# Patient Record
Sex: Female | Born: 1980 | Race: White | Hispanic: No | Marital: Married | Smoking: Never smoker
Health system: Southern US, Community
[De-identification: ages and names within clinical notes are randomized; demographics above are authoritative.]

## PROBLEM LIST (undated history)

## (undated) DIAGNOSIS — R112 Nausea with vomiting, unspecified: Secondary | ICD-10-CM

## (undated) DIAGNOSIS — Z9889 Other specified postprocedural states: Secondary | ICD-10-CM

## (undated) DIAGNOSIS — R87619 Unspecified abnormal cytological findings in specimens from cervix uteri: Secondary | ICD-10-CM

## (undated) HISTORY — DX: Unspecified abnormal cytological findings in specimens from cervix uteri: R87.619

## (undated) HISTORY — PX: WISDOM TOOTH EXTRACTION: SHX21

---

## 2006-10-25 ENCOUNTER — Inpatient Hospital Stay: Payer: Self-pay | Admitting: Unknown Physician Specialty

## 2010-10-04 ENCOUNTER — Inpatient Hospital Stay: Payer: Self-pay | Admitting: Obstetrics and Gynecology

## 2014-09-10 ENCOUNTER — Other Ambulatory Visit: Payer: Self-pay

## 2014-09-10 ENCOUNTER — Other Ambulatory Visit: Payer: Self-pay | Admitting: Obstetrics and Gynecology

## 2014-09-10 DIAGNOSIS — N926 Irregular menstruation, unspecified: Secondary | ICD-10-CM

## 2014-09-11 ENCOUNTER — Other Ambulatory Visit: Payer: Self-pay | Admitting: Obstetrics and Gynecology

## 2014-09-11 LAB — BETA HCG QUANT (REF LAB)

## 2014-09-11 MED ORDER — MEDROXYPROGESTERONE ACETATE 10 MG PO TABS: 10.0000 mg | ORAL_TABLET | Freq: Every day | ORAL | Status: DC

## 2014-09-24 ENCOUNTER — Other Ambulatory Visit: Payer: Self-pay | Admitting: Obstetrics and Gynecology

## 2014-09-24 DIAGNOSIS — N911 Secondary amenorrhea: Secondary | ICD-10-CM

## 2014-09-25 ENCOUNTER — Ambulatory Visit: Payer: Self-pay

## 2014-09-25 ENCOUNTER — Other Ambulatory Visit: Payer: Self-pay | Admitting: Obstetrics and Gynecology

## 2014-09-25 ENCOUNTER — Encounter: Payer: Self-pay | Admitting: Obstetrics and Gynecology

## 2014-09-25 ENCOUNTER — Ambulatory Visit (INDEPENDENT_AMBULATORY_CARE_PROVIDER_SITE_OTHER): Payer: BLUE CROSS/BLUE SHIELD | Admitting: Obstetrics and Gynecology

## 2014-09-25 VITALS — BP 106/77 | HR 72 | Ht 66.0 in | Wt 152.7 lb

## 2014-09-25 DIAGNOSIS — N911 Secondary amenorrhea: Secondary | ICD-10-CM

## 2014-09-25 DIAGNOSIS — N926 Irregular menstruation, unspecified: Secondary | ICD-10-CM

## 2014-09-25 LAB — BETA HCG QUANT (REF LAB): HCG QUANT: 3500 m[IU]/mL

## 2014-09-25 NOTE — Progress Notes (Signed)
Patient ID: [name], female   DOB: Dec 26, 1980, 34 y.o.   MRN: [id number]  Here for ultrasound due to secondary amenorrhea after stopping Nuvaring 8 weeks ago:  S: reports breast tenderness and fatigue x 1-2 weeks  O: Indications: Amenorrhea Findings:  The uterus measures 8.2 x 6.1 x 5.2 cm. Echo texture is homogenous without evidence of focal masses.  The Endometrium measures 15.0 mm, and appears to have a decidual reaction appearance. There is a small anechoic/cystic type structure within the endometrial canal which may represent an early gest sac measuring for 5 weeks 2 day. There is also what appears to be a subchorionic hemorrhage measuring 1.3 x 0.5 cm.   Right Ovary measures 3.1 x 2.0 x 2.6 cm. A 1.1 x 1.2 x 1.4 cm corpus luteal cyst is also seen. Otherwise, appears WNL.  Left Ovary measures 2.3 x 1.2 x 1.2 cm. It is normal appearance. Survey of the adnexa demonstrates no adnexal masses. There is a trace of free fluid seen in the cul-de-sac.   Impression: 1. Possible decidual reaction with early gest sac vs other eitology seen in the endometrium.  2. Corpus luteal cyst seen on right ovary.   Recommendations: 1.Clinical correlation with the patient's History and Physical Exam.   Elliott,Teresa, Rad Tech    Scan reviewed and agree with findings. Quant Bhcg repeated and will follow up accordingly.  Reviewed findings with patient  Ashley Barrett, CNM

## 2014-10-04 ENCOUNTER — Ambulatory Visit (INDEPENDENT_AMBULATORY_CARE_PROVIDER_SITE_OTHER): Payer: BLUE CROSS/BLUE SHIELD | Admitting: Obstetrics and Gynecology

## 2014-10-04 ENCOUNTER — Other Ambulatory Visit: Payer: BLUE CROSS/BLUE SHIELD

## 2014-10-04 ENCOUNTER — Encounter: Payer: Self-pay | Admitting: Obstetrics and Gynecology

## 2014-10-04 ENCOUNTER — Other Ambulatory Visit: Payer: Self-pay | Admitting: Obstetrics and Gynecology

## 2014-10-04 DIAGNOSIS — N926 Irregular menstruation, unspecified: Secondary | ICD-10-CM

## 2014-10-04 NOTE — Progress Notes (Signed)
Indications:Unsure LMP Findings:  Ashley Barrett intrauterine pregnancy is visualized with a CRL consistent with 6 0/[redacted] weeks gestation, giving an (U/S) EDD of 05/30/2015. The (U/S) EDD is NOT consistent with the clinically established (LMP) EDD of 05/06/2015 by more than 1 week.  FHR: 115 bpm CRL measurement: 3.6 mm Yolk sac and and early anatomy is normal.  Right Ovary measures 3.0 x 2.2 x 2.2 cm. It is normal in appearance. Left Ovary measures 2.6 x 1.1 x  1.9 cm. It is normal appearance. There is evidence of a corpus luteal cyst in the Right Survey of the adnexa demonstrates no adnexal masses. There is no free peritoneal fluid in the cul de sac.  Impression: 1. 6 0/7 week Viable Singleton Intrauterine pregnancy by U/S. 2. (U/S) EDD is NOT consistent with Clinically established (LMP) EDD of 05/06/2015 .  Recommendations: 1.Clinical correlation with the patient's History and Physical Exam. 2. Recommend changing EDC to 05/30/2015.  Lewis,Amber, [employer] reviewed and agree with findings. EDC changed to 05/30/15 Reviewed with patient and spouse.  Melody Trudee Kuster, CNM

## 2014-10-04 NOTE — Patient Instructions (Signed)
First Trimester of Pregnancy The first trimester of pregnancy is from week 1 until the end of week 12 (months 1 through 3). A week after a sperm fertilizes an egg, the egg will implant on the wall of the uterus. This embryo will begin to develop into a baby. Genes from you and your partner are forming the baby. The female genes determine whether the baby is a boy or a girl. At 6-8 weeks, the eyes and face are formed, and the heartbeat can be seen on ultrasound. At the end of 12 weeks, all the baby's organs are formed.  Now that you are pregnant, you will want to do everything you can to have a healthy baby. Two of the most important things are to get good prenatal care and to follow your health care provider's instructions. Prenatal care is all the medical care you receive before the baby's birth. This care will help prevent, find, and treat any problems during the pregnancy and childbirth. BODY CHANGES Your body goes through many changes during pregnancy. The changes vary from woman to woman.   You may gain or lose a couple of pounds at first.  You may feel sick to your stomach (nauseous) and throw up (vomit). If the vomiting is uncontrollable, call your health care provider.  You may tire easily.  You may develop headaches that can be relieved by medicines approved by your health care provider.  You may urinate more often. Painful urination may mean you have a bladder infection.  You may develop heartburn as a result of your pregnancy.  You may develop constipation because certain hormones are causing the muscles that push waste through your intestines to slow down.  You may develop hemorrhoids or swollen, bulging veins (varicose veins).  Your breasts may begin to grow larger and become tender. Your nipples may stick out more, and the tissue that surrounds them (areola) may become darker.  Your gums may bleed and may be sensitive to brushing and flossing.  Dark spots or blotches (chloasma,  mask of pregnancy) may develop on your face. This will likely fade after the baby is born.  Your menstrual periods will stop.  You may have a loss of appetite.  You may develop cravings for certain kinds of food.  You may have changes in your emotions from day to day, such as being excited to be pregnant or being concerned that something may go wrong with the pregnancy and baby.  You may have more vivid and strange dreams.  You may have changes in your hair. These can include thickening of your hair, rapid growth, and changes in texture. Some women also have hair loss during or after pregnancy, or hair that feels dry or thin. Your hair will most likely return to normal after your baby is born. WHAT TO EXPECT AT YOUR PRENATAL VISITS During a routine prenatal visit:  You will be weighed to make sure you and the baby are growing normally.  Your blood pressure will be taken.  Your abdomen will be measured to track your baby's growth.  The fetal heartbeat will be listened to starting around week 10 or 12 of your pregnancy.  Test results from any previous visits will be discussed. Your health care provider may ask you:  How you are feeling.  If you are feeling the baby move.  If you have had any abnormal symptoms, such as leaking fluid, bleeding, severe headaches, or abdominal cramping.  If you have any questions. Other tests   that may be performed during your first trimester include:  Blood tests to find your blood type and to check for the presence of any previous infections. They will also be used to check for low iron levels (anemia) and Rh antibodies. Later in the pregnancy, blood tests for diabetes will be done along with other tests if problems develop.  Urine tests to check for infections, diabetes, or protein in the urine.  An ultrasound to confirm the proper growth and development of the baby.  An amniocentesis to check for possible genetic problems.  Fetal screens for  spina bifida and Down syndrome.  You may need other tests to make sure you and the baby are doing well. HOME CARE INSTRUCTIONS  Medicines  Follow your health care provider's instructions regarding medicine use. Specific medicines may be either safe or unsafe to take during pregnancy.  Take your prenatal vitamins as directed.  If you develop constipation, try taking a stool softener if your health care provider approves. Diet  Eat regular, well-balanced meals. Choose a variety of foods, such as meat or vegetable-based protein, fish, milk and low-fat dairy products, vegetables, fruits, and whole grain breads and cereals. Your health care provider will help you determine the amount of weight gain that is right for you.  Avoid raw meat and uncooked cheese. These carry germs that can cause birth defects in the baby.  Eating four or five small meals rather than three large meals a day may help relieve nausea and vomiting. If you start to feel nauseous, eating a few soda crackers can be helpful. Drinking liquids between meals instead of during meals also seems to help nausea and vomiting.  If you develop constipation, eat more high-fiber foods, such as fresh vegetables or fruit and whole grains. Drink enough fluids to keep your urine clear or pale yellow. Activity and Exercise  Exercise only as directed by your health care provider. Exercising will help you:  Control your weight.  Stay in shape.  Be prepared for labor and delivery.  Experiencing pain or cramping in the lower abdomen or low back is a good sign that you should stop exercising. Check with your health care provider before continuing normal exercises.  Try to avoid standing for long periods of time. Move your legs often if you must stand in one place for a long time.  Avoid heavy lifting.  Wear low-heeled shoes, and practice good posture.  You may continue to have sex unless your health care provider directs you  otherwise. Relief of Pain or Discomfort  Wear a good support bra for breast tenderness.   Take warm sitz baths to soothe any pain or discomfort caused by hemorrhoids. Use hemorrhoid cream if your health care provider approves.   Rest with your legs elevated if you have leg cramps or low back pain.  If you develop varicose veins in your legs, wear support hose. Elevate your feet for 15 minutes, 3-4 times a day. Limit salt in your diet. Prenatal Care  Schedule your prenatal visits by the twelfth week of pregnancy. They are usually scheduled monthly at first, then more often in the last 2 months before delivery.  Write down your questions. Take them to your prenatal visits.  Keep all your prenatal visits as directed by your health care provider. Safety  Wear your seat belt at all times when driving.  Make a list of emergency phone numbers, including numbers for family, friends, the hospital, and police and fire departments. General Tips    Ask your health care provider for a referral to a local prenatal education class. Begin classes no later than at the beginning of month 6 of your pregnancy.  Ask for help if you have counseling or nutritional needs during pregnancy. Your health care provider can offer advice or refer you to specialists for help with various needs.  Do not use hot tubs, steam rooms, or saunas.  Do not douche or use tampons or scented sanitary pads.  Do not cross your legs for long periods of time.  Avoid cat litter boxes and soil used by cats. These carry germs that can cause birth defects in the baby and possibly loss of the fetus by miscarriage or stillbirth.  Avoid all smoking, herbs, alcohol, and medicines not prescribed by your health care provider. Chemicals in these affect the formation and growth of the baby.  Schedule a dentist appointment. At home, brush your teeth with a soft toothbrush and be gentle when you floss. SEEK MEDICAL CARE IF:   You have  dizziness.  You have mild pelvic cramps, pelvic pressure, or nagging pain in the abdominal area.  You have persistent nausea, vomiting, or diarrhea.  You have a bad smelling vaginal discharge.  You have pain with urination.  You notice increased swelling in your face, hands, legs, or ankles. SEEK IMMEDIATE MEDICAL CARE IF:   You have a fever.  You are leaking fluid from your vagina.  You have spotting or bleeding from your vagina.  You have severe abdominal cramping or pain.  You have rapid weight gain or loss.  You vomit blood or material that looks like coffee grounds.  You are exposed to German measles and have never had them.  You are exposed to fifth disease or chickenpox.  You develop a severe headache.  You have shortness of breath.  You have any kind of trauma, such as from a fall or a car accident. Document Released: 01/05/2001 Document Revised: 05/28/2013 Document Reviewed: 11/21/2012 ExitCare Patient Information 2015 [employer]. This information is not intended to replace advice given to you by your health care provider. Make sure you discuss any questions you have with your health care provider.  

## 2014-10-17 ENCOUNTER — Other Ambulatory Visit: Payer: BLUE CROSS/BLUE SHIELD

## 2014-10-18 LAB — BETA HCG QUANT (REF LAB): HCG QUANT: 209472 m[IU]/mL

## 2014-11-08 ENCOUNTER — Encounter: Payer: Self-pay | Admitting: Obstetrics and Gynecology

## 2014-11-08 ENCOUNTER — Ambulatory Visit (INDEPENDENT_AMBULATORY_CARE_PROVIDER_SITE_OTHER): Payer: BLUE CROSS/BLUE SHIELD | Admitting: Obstetrics and Gynecology

## 2014-11-08 VITALS — BP 105/74 | HR 69 | Wt 152.0 lb

## 2014-11-08 DIAGNOSIS — Z3491 Encounter for supervision of normal pregnancy, unspecified, first trimester: Secondary | ICD-10-CM

## 2014-11-08 LAB — POCT URINALYSIS DIPSTICK
Bilirubin, UA: NEGATIVE
Blood, UA: NEGATIVE
Glucose, UA: NEGATIVE
KETONES UA: 5
Nitrite, UA: NEGATIVE
PH UA: 6
PROTEIN UA: NEGATIVE
Spec Grav, UA: 1.01
UROBILINOGEN UA: 0.2

## 2014-11-08 NOTE — Patient Instructions (Signed)

## 2014-11-08 NOTE — Progress Notes (Signed)
NOB-pt is having some cramping, slight nausea

## 2014-11-08 NOTE — Progress Notes (Signed)
NEW OB HISTORY AND PHYSICAL  SUBJECTIVE:       Ashley Barrett is a 34 y.o. G1P0 female, Patient's last menstrual period was 08/23/2014., Estimated Date of Delivery: 05/30/15, [redacted]w[redacted]d, presents today for establishment of Prenatal Care. She has no unusual complaints and complains of fatigue      Gynecologic History Patient's last menstrual period was 08/23/2014. Abnormal Contraception: none Last Pap: 2016. Results were: normal  Obstetric History OB History  Gravida Para Term Preterm AB SAB TAB Ectopic Multiple Living  1             # Outcome Date GA Lbr Len/2nd Weight Sex Delivery Anes PTL Lv  1 Current               History reviewed. No pertinent past medical history.  History reviewed. No pertinent past surgical history.  Current Outpatient Prescriptions on File Prior to Visit  Medication Sig Dispense Refill  . Prenatal Vit-Fe Fumarate-FA (PRENATAL MULTIVITAMIN) TABS tablet Take 1 tablet by mouth daily at 12 noon.    . medroxyPROGESTERone (PROVERA) 10 MG tablet Take 1 tablet (10 mg total) by mouth daily. Use for ten days (Patient not taking: Reported on 09/25/2014) 10 tablet 2   No current facility-administered medications on file prior to visit.    No Known Allergies  Social History   Social History  . Marital Status: Married    Spouse Name: N/A  . Number of Children: N/A  . Years of Education: N/A   Occupational History  . Not on file.   Social History Main Topics  . Smoking status: Never Smoker   . Smokeless tobacco: Never Used  . Alcohol Use: No  . Drug Use: No  . Sexual Activity: Yes    Birth Control/ Protection: None   Other Topics Concern  . Not on file   Social History Narrative    History reviewed. No pertinent family history.  The following portions of the patient's history were reviewed and updated as appropriate: allergies, current medications, past OB history, past medical history, past surgical history, past family history, past social  history, and problem list.    OBJECTIVE: Initial Physical Exam (New OB)  GENERAL APPEARANCE: alert, well appearing, in no apparent distress, oriented to person, place and time HEAD: normocephalic, atraumatic MOUTH: mucous membranes moist, pharynx normal without lesions THYROID: no thyromegaly or masses present BREASTS: no masses noted, no significant tenderness, no palpable axillary nodes, no skin changes LUNGS: clear to auscultation, no wheezes, rales or rhonchi, symmetric air entry HEART: regular rate and rhythm, no murmurs ABDOMEN: soft, nontender, nondistended, no abnormal masses, no epigastric pain, fundus soft, nontender 14 weeks size and FHT present EXTREMITIES: no redness or tenderness in the calves or thighs, no edema SKIN: normal coloration and turgor, no rashes LYMPH NODES: no adenopathy palpable NEUROLOGIC: alert, oriented, normal speech, no focal findings or movement disorder noted  PELVIC EXAM VAGINA: no abnormal discharge or lesions CERVIX: no lesions or cervical motion tenderness UTERUS: gravid and consistent with 14 weeks ADNEXA: no masses palpable and nontender  ASSESSMENT: Normal pregnancy  PLAN: Genetic screening done Prenatal care See orders

## 2014-11-09 LAB — CBC WITH DIFFERENTIAL/PLATELET
BASOS ABS: 0 10*3/uL (ref 0.0–0.2)
Basos: 1 %
EOS (ABSOLUTE): 0.1 10*3/uL (ref 0.0–0.4)
Eos: 1 %
HEMOGLOBIN: 13.3 g/dL (ref 11.1–15.9)
Hematocrit: 38.5 % (ref 34.0–46.6)
Immature Grans (Abs): 0 10*3/uL (ref 0.0–0.1)
Immature Granulocytes: 1 %
LYMPHS ABS: 1.9 10*3/uL (ref 0.7–3.1)
LYMPHS: 33 %
MCH: 30.2 pg (ref 26.6–33.0)
MCHC: 34.5 g/dL (ref 31.5–35.7)
MCV: 88 fL (ref 79–97)
MONOCYTES: 9 %
Monocytes Absolute: 0.5 10*3/uL (ref 0.1–0.9)
NEUTROS PCT: 55 %
Neutrophils Absolute: 3.2 10*3/uL (ref 1.4–7.0)
Platelets: 191 10*3/uL (ref 150–379)
RBC: 4.4 x10E6/uL (ref 3.77–5.28)
RDW: 13.7 % (ref 12.3–15.4)
WBC: 5.9 10*3/uL (ref 3.4–10.8)

## 2014-11-09 LAB — HEP, RPR, HIV PANEL
HEP B S AG: NEGATIVE
HIV Screen 4th Generation wRfx: NONREACTIVE
RPR: NONREACTIVE

## 2014-11-09 LAB — ABO AND RH: Rh Factor: POSITIVE

## 2014-11-09 LAB — ANTIBODY SCREEN: ANTIBODY SCREEN: NEGATIVE

## 2014-11-10 LAB — RUBELLA SCREEN: Rubella Antibodies, IGG: 2.13 index (ref >0.99)

## 2014-11-10 LAB — VARICELLA ZOSTER ANTIBODY, IGG: Varicella zoster IgG: 887 index (ref >165)

## 2014-11-14 ENCOUNTER — Encounter: Payer: Self-pay | Admitting: Obstetrics and Gynecology

## 2014-11-19 ENCOUNTER — Other Ambulatory Visit: Payer: Self-pay | Admitting: Obstetrics and Gynecology

## 2014-11-25 ENCOUNTER — Encounter: Payer: Self-pay | Admitting: Obstetrics and Gynecology

## 2014-12-04 ENCOUNTER — Encounter: Payer: Self-pay | Admitting: Obstetrics and Gynecology

## 2014-12-04 ENCOUNTER — Ambulatory Visit (INDEPENDENT_AMBULATORY_CARE_PROVIDER_SITE_OTHER): Payer: BLUE CROSS/BLUE SHIELD | Admitting: Obstetrics and Gynecology

## 2014-12-04 VITALS — BP 114/85 | HR 90 | Wt 151.7 lb

## 2014-12-04 DIAGNOSIS — Z3492 Encounter for supervision of normal pregnancy, unspecified, second trimester: Secondary | ICD-10-CM

## 2014-12-04 LAB — POCT URINALYSIS DIPSTICK
Bilirubin, UA: NEGATIVE
Blood, UA: NEGATIVE
Glucose, UA: NEGATIVE
Ketones, UA: NEGATIVE
Leukocytes, UA: NEGATIVE
Nitrite, UA: NEGATIVE
Protein, UA: NEGATIVE
Spec Grav, UA: 1.015
Urobilinogen, UA: 0.2
pH, UA: 6

## 2014-12-04 NOTE — Progress Notes (Signed)
ROB- doing well, anatomy scan next visit

## 2014-12-04 NOTE — Progress Notes (Signed)
ROB-pt is feeling good, denies any new complaints

## 2015-01-01 ENCOUNTER — Ambulatory Visit (INDEPENDENT_AMBULATORY_CARE_PROVIDER_SITE_OTHER): Payer: BLUE CROSS/BLUE SHIELD

## 2015-01-01 ENCOUNTER — Ambulatory Visit (INDEPENDENT_AMBULATORY_CARE_PROVIDER_SITE_OTHER): Payer: BLUE CROSS/BLUE SHIELD | Admitting: Obstetrics and Gynecology

## 2015-01-01 VITALS — BP 108/69 | HR 95 | Wt 154.6 lb

## 2015-01-01 DIAGNOSIS — Z3492 Encounter for supervision of normal pregnancy, unspecified, second trimester: Secondary | ICD-10-CM | POA: Diagnosis not present

## 2015-01-01 DIAGNOSIS — O4402 Placenta previa specified as without hemorrhage, second trimester: Secondary | ICD-10-CM

## 2015-01-01 DIAGNOSIS — O4412 Placenta previa with hemorrhage, second trimester: Secondary | ICD-10-CM

## 2015-01-01 NOTE — Patient Instructions (Signed)
Placenta Previa Placenta previa is a condition in pregnant women where the placenta implants in the lower part of the uterus. The placenta either partially or completely covers the opening to the cervix. This is a problem because the baby must pass through the cervix during delivery. There are three types of placenta previa. They include:  1. Marginal placenta previa. The placenta is near the cervix, but does not cover the opening. 2. Partial placenta previa. The placenta covers part of the cervical opening. 3. Complete placenta previa. The placenta covers the entire cervical opening.  Depending on the type of placenta previa, there is a chance the placenta may move into a normal position and no longer cover the cervix as the pregnancy progresses. It is important to keep all prenatal visits with your caregiver.  RISK FACTORS You may be more likely to develop placenta previa if you:   Are carrying more than one baby (multiples).   Have an abnormally shaped uterus.   Have scars on the lining of the uterus.   Had previous surgeries involving the uterus, such as a cesarean delivery.   Have delivered a baby previously.   Have a history of placenta previa.   Have smoked or used cocaine during pregnancy.   Are age 31 or older during pregnancy.  SYMPTOMS The main symptom of placenta previa is sudden, painless vaginal bleeding during the second half of pregnancy. The amount of bleeding can be light to very heavy. The bleeding may stop on its own, but almost always returns. Cramping, regular contractions, abdominal pain, and lower back pain can also occur with placenta previa.  DIAGNOSIS Placenta previa can be diagnosed through an ultrasound by finding where the placenta is located. The ultrasound may find placenta previa either during a routine prenatal visit or after vaginal bleeding is noticed. If you are diagnosed with placenta previa, your caregiver may avoid vaginal exams to reduce  the risk of heavy bleeding. There is a chance that placenta previa may not be diagnosed until bleeding occurs during labor.  TREATMENT Specific treatment depends on:   How much you are bleeding or if the bleeding has stopped.  How far along you are in your pregnancy.   The condition of the baby.   The location of the baby and placenta.   The type of placenta previa.  Depending on the factors above, your caregiver may recommend:   Decreased activity.   Bed rest at home or in the hospital.  Pelvic rest. This means no sex, using tampons, douching, pelvic exams, or placing anything into the vagina.  A blood transfusion to replace maternal blood loss.  A cesarean delivery if the bleeding is heavy and cannot be controlled or the placenta completely covers the cervix.  Medication to stop premature labor or mature the fetal lungs if delivery is needed before the pregnancy is full term.  WHEN SHOULD YOU SEEK IMMEDIATE MEDICAL CARE IF YOU ARE SENT HOME WITH PLACENTA PREVIA? Seek immediate medical care if you show any symptoms of placenta previa. You will need to go to the hospital to get checked immediately. Again, those symptoms are:  Sudden, painless vaginal bleeding, even a small amount.  Cramping or regular contractions.  Lower back or abdominal pain.   This information is not intended to replace advice given to you by your health care provider. Make sure you discuss any questions you have with your health care provider.   Document Released: 01/11/2005 Document Revised: 02/01/2014 Document Reviewed: 04/14/2012 Elsevier  Interactive Patient Education 2016 [employer]  

## 2015-01-01 NOTE — Progress Notes (Signed)
Indications:Anatomy U/S Findings:  Singleton intrauterine pregnancy is visualized with FHR at 149 BPM. Biometrics give an (U/S) Gestational age of 19 2/7 weeks and an (U/S) EDD of 05/26/15; this correlates with the clinically established EDD of 05/30/15.  Fetal presentation is Vertex.  EFW: 284g (10 oz). Placenta: Posterior, grade 0, complete previa. AFI: adequate with MVP of 4cm.  Anatomic survey is complete and normal; Gender - female  .   Right Ovary was not visualized. Left Ovary was not visualized. Survey of the adnexa demonstrates no adnexal masses. There is no free peritoneal fluid in the cul de sac.  Impression: 1. 19 2/7 week Viable Singleton Intrauterine pregnancy by U/S. 2. (U/S) EDD is consistent with Clinically established (LMP) EDD of 05/30/15. 3. Complete placenta previa.  Recommendations: 1.Clinical correlation with the patient's History and Physical Exam  Ashley Barrett  Scan reviewed and agree with findings, patient and spouse counseled on previa and risks, follow-up and bleeding precautions.

## 2015-01-23 ENCOUNTER — Ambulatory Visit (INDEPENDENT_AMBULATORY_CARE_PROVIDER_SITE_OTHER): Payer: BLUE CROSS/BLUE SHIELD | Admitting: Obstetrics and Gynecology

## 2015-01-23 ENCOUNTER — Encounter: Payer: Self-pay | Admitting: Obstetrics and Gynecology

## 2015-01-23 VITALS — BP 108/64 | HR 95 | Wt 158.7 lb

## 2015-01-23 DIAGNOSIS — J029 Acute pharyngitis, unspecified: Secondary | ICD-10-CM | POA: Diagnosis not present

## 2015-01-23 DIAGNOSIS — Z3492 Encounter for supervision of normal pregnancy, unspecified, second trimester: Secondary | ICD-10-CM

## 2015-01-23 DIAGNOSIS — K219 Gastro-esophageal reflux disease without esophagitis: Secondary | ICD-10-CM

## 2015-01-23 DIAGNOSIS — K141 Geographic tongue: Secondary | ICD-10-CM | POA: Diagnosis not present

## 2015-01-23 DIAGNOSIS — J0300 Acute streptococcal tonsillitis, unspecified: Secondary | ICD-10-CM

## 2015-01-23 LAB — POCT URINALYSIS DIPSTICK
Bilirubin, UA: NEGATIVE
Blood, UA: NEGATIVE
GLUCOSE UA: NEGATIVE
KETONES UA: NEGATIVE
LEUKOCYTES UA: NEGATIVE
Nitrite, UA: NEGATIVE
Spec Grav, UA: 1.015
Urobilinogen, UA: 0.2
pH, UA: 6.5

## 2015-01-23 LAB — POCT RAPID STREP A (OFFICE): Rapid Strep A Screen: POSITIVE — AB

## 2015-01-23 MED ORDER — MAGIC MOUTHWASH: 5.0000 mL | Freq: Three times a day (TID) | ORAL | Status: DC | PRN

## 2015-01-23 MED ORDER — CEFDINIR 300 MG PO CAPS: 300.0000 mg | ORAL_CAPSULE | Freq: Two times a day (BID) | ORAL | Status: DC

## 2015-01-23 MED ORDER — CEFDINIR 125 MG/5ML PO SUSR: 125.0000 mg | Freq: Two times a day (BID) | ORAL | Status: DC

## 2015-01-23 MED ORDER — RANITIDINE HCL 150 MG PO TABS: 150.0000 mg | ORAL_TABLET | Freq: Two times a day (BID) | ORAL | Status: DC

## 2015-01-23 NOTE — Patient Instructions (Signed)

## 2015-01-23 NOTE — Progress Notes (Signed)
Problem OB- reports sore throat and low grade fever x 3 days + strep today- rx for omnicef sent in with refill, to take tylenol prn, also sent in zantac 150mg  for reflux.

## 2015-01-23 NOTE — Addendum Note (Signed)
Addended by: Dorette Grate, Steffanie Mingle L on: 01/23/2015 11:40 AM   Modules accepted: Orders

## 2015-01-23 NOTE — Progress Notes (Signed)
OB work in-c/o sore throat, coughing some productive Pt is also c/o acid reflux  Strep test done in the office was positive

## 2015-01-26 NOTE — L&D Delivery Note (Cosign Needed)
Delivery Note At  a viable and healthy female frontal molding with mild briusing was delivered via  (Presentation:OP ;  ).  APGAR8: ,9 ; weight  .   Placenta status:delivered partial spontaneous with manual extraction of retained small piece.  3 Vessel Cord:  with the following complications:retained placenta and uterine atony with PPH .    Anesthesia:  none Episiotomy:  none Lacerations:  2nd degree Suture Repair: 3.0 vicryl rapide Est. Blood Loss (mL):  1450  Mom to postpartum.  Baby to Couplet care / Skin to Skin.  Melody [employer], CNM 05/24/2015, 12:45 PM

## 2015-01-31 ENCOUNTER — Ambulatory Visit (INDEPENDENT_AMBULATORY_CARE_PROVIDER_SITE_OTHER): Payer: BLUE CROSS/BLUE SHIELD | Admitting: Obstetrics and Gynecology

## 2015-01-31 ENCOUNTER — Encounter: Payer: Self-pay | Admitting: Obstetrics and Gynecology

## 2015-01-31 VITALS — BP 106/71 | HR 82 | Wt 160.1 lb

## 2015-01-31 DIAGNOSIS — Z331 Pregnant state, incidental: Secondary | ICD-10-CM

## 2015-01-31 LAB — POCT URINALYSIS DIPSTICK
BILIRUBIN UA: NEGATIVE
Blood, UA: NEGATIVE
GLUCOSE UA: NEGATIVE
Ketones, UA: NEGATIVE
LEUKOCYTES UA: NEGATIVE
NITRITE UA: NEGATIVE
PH UA: 6.5
Protein, UA: NEGATIVE
Spec Grav, UA: 1.015
Urobilinogen, UA: 0.2

## 2015-01-31 NOTE — Progress Notes (Signed)
ROB-pt denies any complaints 

## 2015-01-31 NOTE — Progress Notes (Signed)
ROB- doing well, no complaints

## 2015-02-21 ENCOUNTER — Ambulatory Visit (INDEPENDENT_AMBULATORY_CARE_PROVIDER_SITE_OTHER): Payer: BLUE CROSS/BLUE SHIELD | Admitting: Obstetrics and Gynecology

## 2015-02-21 ENCOUNTER — Encounter: Payer: Self-pay | Admitting: Obstetrics and Gynecology

## 2015-02-21 VITALS — BP 122/69 | HR 86 | Wt 164.0 lb

## 2015-02-21 DIAGNOSIS — O99891 Other specified diseases and conditions complicating pregnancy: Secondary | ICD-10-CM | POA: Insufficient documentation

## 2015-02-21 DIAGNOSIS — O9989 Other specified diseases and conditions complicating pregnancy, childbirth and the puerperium: Secondary | ICD-10-CM

## 2015-02-21 DIAGNOSIS — Z20828 Contact with and (suspected) exposure to other viral communicable diseases: Secondary | ICD-10-CM

## 2015-02-21 DIAGNOSIS — Z331 Pregnant state, incidental: Secondary | ICD-10-CM

## 2015-02-21 DIAGNOSIS — Z3A00 Weeks of gestation of pregnancy not specified: Secondary | ICD-10-CM

## 2015-02-21 LAB — POCT URINALYSIS DIPSTICK
BILIRUBIN UA: NEGATIVE
Blood, UA: NEGATIVE
Glucose, UA: NEGATIVE
Ketones, UA: NEGATIVE
LEUKOCYTES UA: NEGATIVE
NITRITE UA: NEGATIVE
PH UA: 7
Spec Grav, UA: 1.01
Urobilinogen, UA: 0.2

## 2015-02-21 NOTE — Progress Notes (Signed)
ROB- doing well w/o complaints- found out 3 patients were positive CMV. Will test with next blood draw.

## 2015-02-21 NOTE — Progress Notes (Signed)
ROB- pt is doing well denies any complaints 

## 2015-02-25 ENCOUNTER — Other Ambulatory Visit: Payer: Self-pay | Admitting: *Deleted

## 2015-03-03 ENCOUNTER — Other Ambulatory Visit: Payer: Self-pay | Admitting: *Deleted

## 2015-03-03 DIAGNOSIS — Z131 Encounter for screening for diabetes mellitus: Secondary | ICD-10-CM

## 2015-03-03 DIAGNOSIS — Z331 Pregnant state, incidental: Secondary | ICD-10-CM

## 2015-03-05 ENCOUNTER — Other Ambulatory Visit: Payer: BLUE CROSS/BLUE SHIELD

## 2015-03-05 ENCOUNTER — Ambulatory Visit (INDEPENDENT_AMBULATORY_CARE_PROVIDER_SITE_OTHER): Payer: BLUE CROSS/BLUE SHIELD

## 2015-03-05 ENCOUNTER — Encounter: Payer: Self-pay | Admitting: Obstetrics and Gynecology

## 2015-03-05 ENCOUNTER — Ambulatory Visit (INDEPENDENT_AMBULATORY_CARE_PROVIDER_SITE_OTHER): Payer: BLUE CROSS/BLUE SHIELD | Admitting: Obstetrics and Gynecology

## 2015-03-05 VITALS — BP 111/67 | HR 88 | Wt 166.5 lb

## 2015-03-05 DIAGNOSIS — Z331 Pregnant state, incidental: Secondary | ICD-10-CM

## 2015-03-05 DIAGNOSIS — Z23 Encounter for immunization: Secondary | ICD-10-CM

## 2015-03-05 DIAGNOSIS — Z3493 Encounter for supervision of normal pregnancy, unspecified, third trimester: Secondary | ICD-10-CM

## 2015-03-05 LAB — POCT URINALYSIS DIPSTICK
BILIRUBIN UA: NEGATIVE
Glucose, UA: 250
KETONES UA: NEGATIVE
Leukocytes, UA: NEGATIVE
Nitrite, UA: NEGATIVE
PH UA: 6.5
RBC UA: NEGATIVE
Spec Grav, UA: 1.015
Urobilinogen, UA: 0.2

## 2015-03-05 MED ORDER — TETANUS-DIPHTH-ACELL PERTUSSIS 5-2.5-18.5 LF-MCG/0.5 IM SUSP
0.5000 mL | Freq: Once | INTRAMUSCULAR | Status: AC
  Administered 2015-03-05: 0.5 mL via INTRAMUSCULAR

## 2015-03-05 NOTE — Progress Notes (Signed)
ROB-glucola done, blood consent signed, tdap given

## 2015-03-05 NOTE — Patient Instructions (Signed)
Cesarean Delivery Cesarean delivery is the birth of a baby through a cut (incision) in the abdomen and womb (uterus).  LET YOUR HEALTH CARE PROVIDER KNOW ABOUT:  All medicines you are taking, including vitamins, herbs, eye drops, creams, and over-the-counter medicines.  Previous problems you or members of your family have had with the use of anesthetics.  Any bleeding or blood clotting disorders you have.  Family history of blood clots or bleeding disorders.  Any history of deep vein thrombosis (DVT) or pulmonary embolism (PE).  Previous surgeries you have had.  Medical conditions you have.  Any allergies you have.  Complicationsinvolving the pregnancy. RISKS AND COMPLICATIONS  Generally, this is a safe procedure. However, as with any procedure, complications can occur. Possible complications include:  Bleeding.  Infection.  Blood clots.  Injury to surrounding organs.  Problems with anesthesia.  Injury to the baby. BEFORE THE PROCEDURE   You may be given an antacid medicine to drink. This will prevent acid contents in your stomach from going into your lungs if you vomit during the surgery.  You may be given an antibiotic medicine to prevent infection. PROCEDURE   To prevent infection of your incision:  Hair may be removed from your pubic area if it is near your incision.  The skin of your pubic area and lower abdomen will be cleaned with a germ-killing solution (antiseptic).  A tube (Foley catheter) will be placed in your bladder to drain your urine from your bladder into a bag. This keeps your bladder empty during surgery.  An IV tube will be placed in your vein.  You may be given medicine to numb the lower half of your body (regional anesthetic). If you were in labor, you may have already had an epidural in place which can be used in both labor and cesarean delivery. You may possibly be given medicine to make you sleep (general anesthetic) though this is not as  common.  Your heart rate and your baby's heart rate will be monitored.  An incision will be made in your abdomen that extends to your uterus. There are 2 basic kinds of incisions:  The horizontal (transverse) incision. Horizontal incisions are from side to side and are used for most routine cesarean deliveries.  The vertical incision. The vertical incision is from the top of the abdomen to the bottom and is less commonly used. It is often done for women who have a serious complication (extreme prematurity) or under emergency situations.  The horizontal and vertical incisions may both be used at the same time. However, this is very uncommon.  An incision is then made in your uterus to deliver the baby.  Your baby will be delivered.  Your health care provider may place the baby on your chest. It is important to keep the baby warm. Your health care provider will dry off the baby, place the baby directly on your bare skin, and cover the baby with warm, dry blankets.  Both incisions will be closed with absorbable stitches. AFTER THE PROCEDURE   If you were awake during the surgery, you will see your baby right away. If you were asleep, you will see your baby as soon as you are awake.  You may breastfeed your baby after surgery.  You may be able to get up and walk the same day as the surgery. If you need to stay in bed for a period of time, you will receive help to turn, cough, and take deep breaths after   surgery. This helps prevent lung problems such as pneumonia.  Do not get out of bed alone the first time after surgery. You will need help getting out of bed until you are able to do this by yourself.  You may be able to shower the day after your cesarean delivery. After the bandage (dressing) is taken off the incision site, a nurse will assist you to shower if you would like help.  You may be directed to take actions to help prevent blood clots in your legs. These may  include:  Walking shortly after surgery, with someone assisting you. Moving around after surgery helps to improve blood flow.  Wearing compression stockings or using different types of devices.  Taking medicines to thin your blood (anticoagulants) if you are at high risk for DVT or PE.  Save any blood clots that you pass from your vagina. If you pass a clot while on the toilet, do not flush it. Call for the nurse. Tell the nurse if you think you are bleeding too much or passing too many clots.  You will be given medicine for pain and nausea as needed. Let your health care providers know if you are hurting. You may also be given an antibiotic to prevent an infection.  Your IV tube will be taken out when you are drinking a reasonable amount of fluids. The Foley catheter is taken out when you are up and walking.  If your blood type is Rh negative and your baby's blood type is Rh positive, you will be given a shot of anti-D immune globulin. This shot prevents you from having Rh problems with a future pregnancy. You should get the shot even if you had your tubes tied (tubal ligation).  If you are allowed to take the baby for a walk, place the baby in the bassinet and push it.   This information is not intended to replace advice given to you by your health care provider. Make sure you discuss any questions you have with your health care provider.   Document Released: 01/11/2005 Document Revised: 10/02/2014 Document Reviewed: 09/08/2011 Elsevier Interactive Patient Education 2016 Elsevier Inc[employer]    Postpartum Tubal Ligation Postpartum tubal ligation (PPTL) is a procedure that closes the fallopian tubes right after childbirth or 1-2 days after childbirth. PPTL is done before the uterus returns to its normal location. The procedure is also called a mini-laparotomy. When the fallopian tubes are closed, the eggs that are released from the ovaries cannot enter the uterus, and sperm cannot reach the egg.  PPTL is done so you will not be able to get pregnant or have a baby. Although this procedure may be undone (reversed), it should be considered permanent and irreversible. If you want to have future pregnancies, you should not have this procedure. LET [facility] CARE PROVIDER KNOW ABOUT:  Any allergies you have.  All medicines you are taking, including vitamins, herbs, eye drops, creams, and over-the-counter medicines. This includes any use of steroids, either by mouth or in cream form.  Previous problems you or members of your family have had with the use of anesthetics.  Any blood disorders you have.  Previous surgeries you have had.  Any medical conditions you may have. RISKS AND COMPLICATIONS  Infection.  Bleeding.  Injury to surrounding organs.  Side effects from anesthetics.  Failure of the procedure.  Ectopic pregnancy.  Future regret about having the procedure done. BEFORE THE PROCEDURE  You may need to sign certain documents, including  an informed consent form, up to 30 days before the date of your tubal ligation.  Follow instructions from your health care provider about eating and drinking restrictions. PROCEDURE  If done 1-2 days after a vaginal delivery:  You will be given one or more of the following:  A medicine that helps you relax (sedative).  A medicine that numbs the area (local anesthetic).  A medicine that makes you fall asleep (general anesthetic).  A medicine that is injected into an area of your body that numbs everything below the injection site (regional anesthetic).  If you have been given general anesthetic, a tube will be put down your throat to help you breathe.  Your bladder may be emptied with a small tube (catheter).  A small cut (incision) will be made just above the pubic hair line.  The fallopian tubes will be located and brought up through the incision.  The fallopian tubes will be tied off or burned (cauterized), or they  will be closed with a clamp, ring, or clip. In many cases, a small portion in the center of each fallopian tube will also be removed.  The incision will be closed with stitches (sutures).  A bandage (dressing) will be placed over the incision. The procedure may vary among health care providers and hospitals. If done after a cesarean delivery:  Tubal ligation will be done through the incision that was used for the cesarean delivery of your baby.  After the tubes are closed, the incision will be closed with stitches (sutures).  A bandage (dressing) will be placed over the incision. The procedure may vary among health care providers and hospitals. AFTER THE PROCEDURE  Your blood pressure, heart rate, breathing rate, and blood oxygen level will be monitored often until the medicines you were given have worn off.  You will be given pain medicine as needed.  If you had general anesthetic, you may have some mild discomfort in your throat. This is from the breathing tube that was placed in your throat while you were sleeping.  You may feel tired, and you should rest for the remainder of the day.  You may have some pain or cramps in the abdominal area for 3-7 days.   This information is not intended to replace advice given to you by your health care provider. Make sure you discuss any questions you have with your health care provider.   Document Released: 01/11/2005 Document Revised: 05/28/2014 Document Reviewed: 04/24/2011 Elsevier Interactive Patient Education [employer].

## 2015-03-05 NOTE — Progress Notes (Signed)
Indications: Growth and Follow up Placenta Previa Findings:  Singleton intrauterine pregnancy is visualized with FHR at 145 BPM. Biometrics give an (U/S) Gestational age of [redacted] weeks 2 days, and an (U/S) EDD of 05/26/15; this correlates with the clinically established EDD of 05/30/15.  Fetal presentation is breech, spine right lateral.   EFW: 1159 grams ( 2 lbs. 9 oz. ). 51st percentile for growth Jimmye Norman). Placenta: Grade 1 and complete previa is still observed.  AFI: adequate at 16.4 cm.  Anatomic survey of the stomach, bladder, kidneys, lateral ventricles and posterior fossa appear normal. Gender - female.    Impression: 1. 28 week 2 day Viable Singleton Intrauterine pregnancy by U/S. 2. (U/S) EDD is consistent with Clinically established (LMP) EDD of 05/30/15. 3. Complete posterior placenta previa is again observed.

## 2015-03-06 LAB — CMV IGM: CMV IgM Ser EIA-aCnc: <30 AU/mL (ref 0.0–29.9)

## 2015-03-06 LAB — CBC
HEMATOCRIT: 36 % (ref 34.0–46.6)
HEMOGLOBIN: 12.2 g/dL (ref 11.1–15.9)
MCH: 30.7 pg (ref 26.6–33.0)
MCHC: 33.9 g/dL (ref 31.5–35.7)
MCV: 91 fL (ref 79–97)
Platelets: 143 10*3/uL — ABNORMAL LOW (ref 150–379)
RBC: 3.98 x10E6/uL (ref 3.77–5.28)
RDW: 13.6 % (ref 12.3–15.4)
WBC: 9 10*3/uL (ref 3.4–10.8)

## 2015-03-06 LAB — GLUCOSE TOLERANCE, 1 HOUR: Glucose, 1Hr PP: 88 mg/dL (ref 65–199)

## 2015-03-19 ENCOUNTER — Ambulatory Visit (INDEPENDENT_AMBULATORY_CARE_PROVIDER_SITE_OTHER): Payer: BLUE CROSS/BLUE SHIELD | Admitting: Obstetrics and Gynecology

## 2015-03-19 ENCOUNTER — Encounter: Payer: Self-pay | Admitting: Obstetrics and Gynecology

## 2015-03-19 ENCOUNTER — Ambulatory Visit: Payer: BLUE CROSS/BLUE SHIELD | Admitting: Obstetrics and Gynecology

## 2015-03-19 VITALS — BP 109/74 | HR 86 | Wt 169.6 lb

## 2015-03-19 VITALS — BP 109/74 | HR 86 | Ht 66.0 in | Wt 169.6 lb

## 2015-03-19 DIAGNOSIS — Z331 Pregnant state, incidental: Secondary | ICD-10-CM

## 2015-03-19 LAB — POCT URINALYSIS DIPSTICK
BILIRUBIN UA: NEGATIVE
Blood, UA: NEGATIVE
GLUCOSE UA: NEGATIVE
Ketones, UA: NEGATIVE
LEUKOCYTES UA: NEGATIVE
Nitrite, UA: NEGATIVE
PROTEIN UA: NEGATIVE
Spec Grav, UA: 1.01
Urobilinogen, UA: 0.2
pH, UA: 6.5

## 2015-03-19 NOTE — Progress Notes (Signed)
ROB- doing well, sees Dr Marcelline Mates next visit to discuss possible c/s if placenta persists previa, will rescan at 34 weeks.

## 2015-03-19 NOTE — Progress Notes (Signed)
ROB- pt is having some low back pain

## 2015-03-19 NOTE — Progress Notes (Signed)
See prenatal flow sheet

## 2015-03-27 ENCOUNTER — Encounter: Payer: Self-pay | Admitting: Obstetrics and Gynecology

## 2015-04-03 ENCOUNTER — Ambulatory Visit (INDEPENDENT_AMBULATORY_CARE_PROVIDER_SITE_OTHER): Payer: BLUE CROSS/BLUE SHIELD | Admitting: Obstetrics and Gynecology

## 2015-04-03 ENCOUNTER — Encounter: Payer: Self-pay | Admitting: Obstetrics and Gynecology

## 2015-04-03 VITALS — BP 118/81 | HR 96 | Wt 171.5 lb

## 2015-04-03 DIAGNOSIS — O4402 Placenta previa specified as without hemorrhage, second trimester: Secondary | ICD-10-CM

## 2015-04-03 DIAGNOSIS — Z331 Pregnant state, incidental: Secondary | ICD-10-CM

## 2015-04-03 DIAGNOSIS — Z3493 Encounter for supervision of normal pregnancy, unspecified, third trimester: Secondary | ICD-10-CM

## 2015-04-03 DIAGNOSIS — O4412 Placenta previa with hemorrhage, second trimester: Secondary | ICD-10-CM

## 2015-04-03 LAB — POCT URINALYSIS DIPSTICK
BILIRUBIN UA: NEGATIVE
Glucose, UA: NEGATIVE
KETONES UA: 15
LEUKOCYTES UA: NEGATIVE
Nitrite, UA: NEGATIVE
PH UA: 6
PROTEIN UA: NEGATIVE
RBC UA: NEGATIVE
SPEC GRAV UA: 1.02
Urobilinogen, UA: 0.2

## 2015-04-03 NOTE — Progress Notes (Signed)
ROB- pt is having some B feet swelling

## 2015-04-03 NOTE — Progress Notes (Signed)
Consult OB: Patient referred from [facility] for surgical consultation secondary to persistent complete placenta previa.  Denies vaginal bleeding.  Discussed C-section procedure in detail, including anesthesia and recovery (both in hospital and home care). Patient for next ultrasound in 2 weeks.  If still complete previa, should plan for repeat C-section no later than 38 weeks (according to ACOG).  If low-lying or marginal, can consider vaginal delivery at term.  Would need final sono at ~38 weeks. Patient considering BTL if C-section indicated.  Discussed BTL. RTC in 2 weeks with [facility].

## 2015-04-07 DIAGNOSIS — Z3490 Encounter for supervision of normal pregnancy, unspecified, unspecified trimester: Secondary | ICD-10-CM | POA: Insufficient documentation

## 2015-04-14 ENCOUNTER — Other Ambulatory Visit: Payer: Self-pay | Admitting: Obstetrics and Gynecology

## 2015-04-14 DIAGNOSIS — Z331 Pregnant state, incidental: Secondary | ICD-10-CM

## 2015-04-14 DIAGNOSIS — O4400 Placenta previa specified as without hemorrhage, unspecified trimester: Secondary | ICD-10-CM

## 2015-04-18 ENCOUNTER — Ambulatory Visit (INDEPENDENT_AMBULATORY_CARE_PROVIDER_SITE_OTHER): Payer: BLUE CROSS/BLUE SHIELD | Admitting: Obstetrics and Gynecology

## 2015-04-18 ENCOUNTER — Ambulatory Visit (INDEPENDENT_AMBULATORY_CARE_PROVIDER_SITE_OTHER): Payer: BLUE CROSS/BLUE SHIELD

## 2015-04-18 ENCOUNTER — Encounter: Payer: Self-pay | Admitting: Obstetrics and Gynecology

## 2015-04-18 VITALS — BP 126/78 | HR 91 | Wt 172.1 lb

## 2015-04-18 DIAGNOSIS — Z331 Pregnant state, incidental: Secondary | ICD-10-CM

## 2015-04-18 DIAGNOSIS — O4410 Placenta previa with hemorrhage, unspecified trimester: Secondary | ICD-10-CM

## 2015-04-18 DIAGNOSIS — O4400 Placenta previa specified as without hemorrhage, unspecified trimester: Secondary | ICD-10-CM

## 2015-04-18 DIAGNOSIS — O4443 Low lying placenta NOS or without hemorrhage, third trimester: Secondary | ICD-10-CM

## 2015-04-18 LAB — POCT URINALYSIS DIPSTICK
BILIRUBIN UA: NEGATIVE
Blood, UA: NEGATIVE
Glucose, UA: 250
KETONES UA: NEGATIVE
LEUKOCYTES UA: NEGATIVE
Nitrite, UA: NEGATIVE
PH UA: 6
Spec Grav, UA: 1.015
Urobilinogen, UA: 0.2

## 2015-04-18 NOTE — Progress Notes (Signed)
Indications: Growth and AFI. Placenta check for signs of previa Findings:  Singleton intrauterine pregnancy is visualized with FHR at 135 BPM. Biometrics give an (U/S) Gestational age of [redacted] weeks 3 days, and an (U/S) EDD of 05/27/15; this correlates with the clinically established EDD of 05/30/15.  Fetal presentation is vertex, spine anterior.  EFW: 2456 grams ( 5 lbs. 7 oz. )  51st percentile for growth per [facility] Placenta: Posterior, low lying at 2.2 mm from cervical os, grade 1. AFI: adequate at 22.6 cm.  Anatomic survey of the stomach, bladder, kidneys, lateral ventricle and 4 chamber heart appear WNL.  Impression: 1. 34 week 3 day Viable Singleton Intrauterine pregnancy by U/S. 2. (U/S) EDD is consistent with Clinically established (LMP) EDD of 05/30/15. 3. On Transvaginal scan, the posterior placenta is no longer completely covering the cervical os, but is 2.61mm away.

## 2015-04-18 NOTE — Progress Notes (Signed)
ROB- pt is doing well 

## 2015-04-30 ENCOUNTER — Telehealth: Payer: Self-pay | Admitting: *Deleted

## 2015-04-30 NOTE — Telephone Encounter (Signed)
Notified pt letter is ready and all I need is fax #, done

## 2015-05-01 ENCOUNTER — Ambulatory Visit (INDEPENDENT_AMBULATORY_CARE_PROVIDER_SITE_OTHER): Payer: BLUE CROSS/BLUE SHIELD | Admitting: Obstetrics and Gynecology

## 2015-05-01 VITALS — BP 119/75 | HR 78 | Wt 172.4 lb

## 2015-05-01 DIAGNOSIS — Z36 Encounter for antenatal screening of mother: Secondary | ICD-10-CM

## 2015-05-01 DIAGNOSIS — Z3685 Encounter for antenatal screening for Streptococcus B: Secondary | ICD-10-CM

## 2015-05-01 DIAGNOSIS — Z113 Encounter for screening for infections with a predominantly sexual mode of transmission: Secondary | ICD-10-CM | POA: Diagnosis not present

## 2015-05-01 DIAGNOSIS — Z331 Pregnant state, incidental: Secondary | ICD-10-CM

## 2015-05-01 LAB — POCT URINALYSIS DIPSTICK
Bilirubin, UA: NEGATIVE
Blood, UA: NEGATIVE
Glucose, UA: NEGATIVE
Leukocytes, UA: NEGATIVE
Nitrite, UA: NEGATIVE
PH UA: 6
PROTEIN UA: NEGATIVE
SPEC GRAV UA: 1.01
Urobilinogen, UA: 0.2

## 2015-05-01 NOTE — Progress Notes (Signed)
ROB- cultures obtained, hives that started last night have resolved- unknown etiology, benedryl helped, tongue still feels a little swollen but not visiably.  NST performed today was reviewed and was found to be reactive. Baseline135 with Moderate variability; No decels noted.  Continue recommended antenatal testing and prenatal care.

## 2015-05-01 NOTE — Progress Notes (Signed)
OB work in- pt started with hives yesterday evening

## 2015-05-02 ENCOUNTER — Encounter: Payer: BLUE CROSS/BLUE SHIELD | Admitting: Obstetrics and Gynecology

## 2015-05-02 LAB — GC/CHLAMYDIA PROBE AMP
CHLAMYDIA, DNA PROBE: NEGATIVE
NEISSERIA GONORRHOEAE BY PCR: NEGATIVE

## 2015-05-03 LAB — STREP GP B NAA: STREP GROUP B AG: NEGATIVE

## 2015-05-08 ENCOUNTER — Other Ambulatory Visit: Payer: BLUE CROSS/BLUE SHIELD

## 2015-05-08 ENCOUNTER — Ambulatory Visit (INDEPENDENT_AMBULATORY_CARE_PROVIDER_SITE_OTHER): Payer: BLUE CROSS/BLUE SHIELD | Admitting: Obstetrics and Gynecology

## 2015-05-08 ENCOUNTER — Ambulatory Visit (INDEPENDENT_AMBULATORY_CARE_PROVIDER_SITE_OTHER): Payer: BLUE CROSS/BLUE SHIELD

## 2015-05-08 ENCOUNTER — Encounter: Payer: Self-pay | Admitting: Obstetrics and Gynecology

## 2015-05-08 VITALS — BP 128/86 | HR 88 | Wt 172.4 lb

## 2015-05-08 DIAGNOSIS — O403XX0 Polyhydramnios, third trimester, not applicable or unspecified: Secondary | ICD-10-CM | POA: Insufficient documentation

## 2015-05-08 DIAGNOSIS — Z331 Pregnant state, incidental: Secondary | ICD-10-CM

## 2015-05-08 DIAGNOSIS — O4443 Low lying placenta NOS or without hemorrhage, third trimester: Secondary | ICD-10-CM | POA: Diagnosis not present

## 2015-05-08 LAB — POCT URINALYSIS DIPSTICK
Bilirubin, UA: NEGATIVE
Blood, UA: NEGATIVE
Glucose, UA: 100
KETONES UA: NEGATIVE
LEUKOCYTES UA: NEGATIVE
Nitrite, UA: NEGATIVE
PH UA: 6.5
PROTEIN UA: NEGATIVE
Spec Grav, UA: 1.01
UROBILINOGEN UA: 0.2

## 2015-05-08 NOTE — Progress Notes (Signed)
ROB- pt is having pelvic pressure, low back pain 

## 2015-05-08 NOTE — Progress Notes (Signed)
  Indications:Growth, AFI Findings:  Singleton intrauterine pregnancy is visualized with FHR at 152 BPM. Biometrics give an (U/S) Gestational age of 39 4/7 weeks and an (U/S) EDD of 06/01/15; this correlates with the clinically established EDD of 05/30/15.  Fetal presentation is Vertex.  EFW: 2962g (6lb 8oz) 44th percentile. Placenta: Posterior, Grade 1-2, Low Lying measuring 34mm from internal os. AFI: Polyhydramnios 20.2cm which fall close to the 99th percentile   Impression: 1. 36 4/7 week Viable Singleton Intrauterine pregnancy by U/S. 2. (U/S) EDD is consistent with Clinically established (LMP) EDD of 05/30/15. 3. Polyhydramnios

## 2015-05-15 ENCOUNTER — Ambulatory Visit (INDEPENDENT_AMBULATORY_CARE_PROVIDER_SITE_OTHER): Payer: BLUE CROSS/BLUE SHIELD | Admitting: Obstetrics and Gynecology

## 2015-05-15 ENCOUNTER — Encounter: Payer: Self-pay | Admitting: Obstetrics and Gynecology

## 2015-05-15 VITALS — BP 122/88 | HR 92 | Wt 170.0 lb

## 2015-05-15 DIAGNOSIS — Z331 Pregnant state, incidental: Secondary | ICD-10-CM

## 2015-05-15 LAB — POCT URINALYSIS DIPSTICK
Glucose, UA: NEGATIVE
KETONES UA: NEGATIVE
Leukocytes, UA: NEGATIVE
Nitrite, UA: NEGATIVE
RBC UA: NEGATIVE
SPEC GRAV UA: 1.025
Urobilinogen, UA: 0.2
pH, UA: 6

## 2015-05-15 NOTE — Progress Notes (Signed)
ROB-reports intermittent nausea and heartburn. IOL directions given.

## 2015-05-15 NOTE — Progress Notes (Signed)
ROB-pt c/o some nausea, pelvic pressure

## 2015-05-15 NOTE — H&P (Signed)
Ashley Barrett is a 35 y.o. female presenting for IOL at term due to [place], polyhydramnious and previous LGA infant. History OB History as of 04/18/15    Gravida Para Term Preterm AB TAB SAB Ectopic Multiple Living   3         2     History reviewed. No pertinent past medical history. History reviewed. No pertinent past surgical history. Family History: family history is not on file. Social History:  reports that she has never smoked. She has never used smokeless tobacco. She reports that she does not drink alcohol or use illicit drugs.   Prenatal Transfer Tool  Maternal Diabetes: No Genetic Screening: Normal Maternal Ultrasounds/Referrals: Abnormal:  Findings:   Other:previa- now LLP, Polyhydramnious Fetal Ultrasounds or other Referrals:  None Maternal Substance Abuse:  No Significant Maternal Medications:  None Significant Maternal Lab Results:  None Other Comments:  partial previa resolved and placenta now 2.2cm from os  ROS    Blood pressure 122/88, pulse 92, weight 170 lb (77.111 kg), last menstrual period 08/23/2014, unknown if currently breastfeeding. Exam Physical Exam  HRR Lungs clear Abdomen soft, gravid, non-tender FHT 120s with audible accells and fetal mov't Mild non-pitting pedal edema Prenatal labs: ABO, Rh: O/Positive/-- (10/14 1224) Antibody: Negative (10/14 1224) Rubella: 2.13 (10/14 1224) RPR: Non Reactive (10/14 1224)  HBsAg: Negative (10/14 1224)  HIV: Non Reactive (10/14 1224)  GBS: Negative (04/06 1123)   Assessment/Plan: IOL per pitocin polyhydramnious LLP Previous LGA infant at term   Joylene Igo, CNM 05/15/2015, 2:52 PM     c

## 2015-05-20 ENCOUNTER — Other Ambulatory Visit: Payer: Self-pay | Admitting: *Deleted

## 2015-05-21 ENCOUNTER — Encounter: Payer: BLUE CROSS/BLUE SHIELD | Admitting: Obstetrics and Gynecology

## 2015-05-23 ENCOUNTER — Encounter: Payer: Self-pay | Admitting: *Deleted

## 2015-05-23 ENCOUNTER — Inpatient Hospital Stay
Admission: EM | Admit: 2015-05-23 | Discharge: 2015-05-25 | DRG: 774 | Disposition: A | Discharge status: Home / Self Care | Payer: BLUE CROSS/BLUE SHIELD | Attending: Obstetrics and Gynecology | Admitting: Obstetrics and Gynecology

## 2015-05-23 DIAGNOSIS — O9902 Anemia complicating childbirth: Principal | ICD-10-CM | POA: Diagnosis present

## 2015-05-23 DIAGNOSIS — Z3493 Encounter for supervision of normal pregnancy, unspecified, third trimester: Secondary | ICD-10-CM | POA: Diagnosis not present

## 2015-05-23 MED ORDER — OXYTOCIN 40 UNITS IN LACTATED RINGERS INFUSION - SIMPLE MED
1.0000 m[IU]/min | INTRAVENOUS | Status: DC
  Administered 2015-05-23: 2 m[IU]/min via INTRAVENOUS
  Administered 2015-05-24: 17 m[IU]/min via INTRAVENOUS

## 2015-05-23 MED ORDER — CITRIC ACID-SODIUM CITRATE 334-500 MG/5ML PO SOLN: 30.0000 mL | ORAL | Status: DC | PRN

## 2015-05-23 MED ORDER — OXYTOCIN 40 UNITS IN LACTATED RINGERS INFUSION - SIMPLE MED
INTRAVENOUS | Status: AC
  Filled 2015-05-23: qty 1000

## 2015-05-23 MED ORDER — OXYTOCIN 40 UNITS IN LACTATED RINGERS INFUSION - SIMPLE MED: 2.5000 [IU]/h | INTRAVENOUS | Status: DC

## 2015-05-23 MED ORDER — TERBUTALINE SULFATE 1 MG/ML IJ SOLN: 0.2500 mg | Freq: Once | INTRAMUSCULAR | Status: DC | PRN

## 2015-05-23 MED ORDER — FENTANYL CITRATE (PF) 100 MCG/2ML IJ SOLN: 50.0000 ug | INTRAMUSCULAR | Status: DC | PRN

## 2015-05-23 MED ORDER — ACETAMINOPHEN 325 MG PO TABS
650.0000 mg | ORAL_TABLET | ORAL | Status: DC | PRN
  Administered 2015-05-24: 650 mg via ORAL
  Filled 2015-05-23: qty 2

## 2015-05-23 MED ORDER — OXYTOCIN BOLUS FROM INFUSION: 500.0000 mL | INTRAVENOUS | Status: DC

## 2015-05-23 MED ORDER — OXYCODONE-ACETAMINOPHEN 5-325 MG PO TABS: 1.0000 | ORAL_TABLET | ORAL | Status: DC | PRN

## 2015-05-23 MED ORDER — LACTATED RINGERS IV SOLN
INTRAVENOUS | Status: DC
  Administered 2015-05-23: via INTRAVENOUS

## 2015-05-23 MED ORDER — ONDANSETRON HCL 4 MG/2ML IJ SOLN: 4.0000 mg | Freq: Four times a day (QID) | INTRAMUSCULAR | Status: DC | PRN

## 2015-05-23 MED ORDER — LACTATED RINGERS IV SOLN: 500.0000 mL | INTRAVENOUS | Status: DC | PRN

## 2015-05-23 MED ORDER — OXYCODONE-ACETAMINOPHEN 5-325 MG PO TABS: 2.0000 | ORAL_TABLET | ORAL | Status: DC | PRN

## 2015-05-23 MED ORDER — LIDOCAINE HCL (PF) 1 % IJ SOLN: 30.0000 mL | INTRAMUSCULAR | Status: DC | PRN

## 2015-05-23 NOTE — Progress Notes (Signed)
Ashley Barrett is a 35 y.o. G3P0 at Unknown by LMP admitted for induction of labor due to Elective at term.  Subjective:   Objective: There were no vitals taken for this visit.      FHT:  FHR: 120 bpm, variability: moderate,  accelerations:  Present,  decelerations:  Absent UC:   irregular, every 6-8 minutes SVE:      Labs: Lab Results  Component Value Date   WBC 9.0 03/05/2015   HCT 36.0 03/05/2015   MCV 91 03/05/2015   PLT 143* 03/05/2015    Assessment / Plan: Induction of labor due to previous LGA, LLP,  progressing well on pitocin  Labor: will start induction per pitocin  Preeclampsia:  no signs or symptoms of toxicity Fetal Wellbeing:  Category I Pain Control:  Labor support without medications I/D:  n/a Anticipated MOD:  NSVD  Zubayr Bednarczyk Rockney Ghee, CNM 05/23/2015, 10:39 PM

## 2015-05-24 DIAGNOSIS — Z3493 Encounter for supervision of normal pregnancy, unspecified, third trimester: Secondary | ICD-10-CM

## 2015-05-24 LAB — CBC WITH DIFFERENTIAL/PLATELET
BAND NEUTROPHILS: 0 %
BASOS ABS: 0 10*3/uL (ref 0–0.1)
Basophils Relative: 0 %
Blasts: 0 %
EOS ABS: 0 10*3/uL (ref 0–0.7)
EOS PCT: 0 %
HCT: 35.5 % (ref 35.0–47.0)
Hemoglobin: 11.9 g/dL — ABNORMAL LOW (ref 12.0–16.0)
LYMPHS ABS: 0.6 10*3/uL — AB (ref 1.0–3.6)
Lymphocytes Relative: 3 %
MCH: 29.1 pg (ref 26.0–34.0)
MCHC: 33.4 g/dL (ref 32.0–36.0)
MCV: 87.2 fL (ref 80.0–100.0)
METAMYELOCYTES PCT: 0 %
MONOS PCT: 0 %
Monocytes Absolute: 0 10*3/uL — ABNORMAL LOW (ref 0.2–0.9)
Myelocytes: 0 %
NEUTROS ABS: 19.6 10*3/uL — AB (ref 1.4–6.5)
Neutrophils Relative %: 97 %
Other: 0 %
PLATELETS: 134 10*3/uL — AB (ref 150–440)
Promyelocytes Absolute: 0 %
RBC: 4.07 MIL/uL (ref 3.80–5.20)
RDW: 15.9 % — AB (ref 11.5–14.5)
WBC: 20.2 10*3/uL — ABNORMAL HIGH (ref 3.6–11.0)
nRBC: 0 /100 WBC

## 2015-05-24 LAB — CBC
HCT: 37.3 % (ref 35.0–47.0)
HEMOGLOBIN: 12.6 g/dL (ref 12.0–16.0)
MCH: 29.5 pg (ref 26.0–34.0)
MCHC: 33.7 g/dL (ref 32.0–36.0)
MCV: 87.6 fL (ref 80.0–100.0)
PLATELETS: 135 10*3/uL — AB (ref 150–440)
RBC: 4.26 MIL/uL (ref 3.80–5.20)
RDW: 15.6 % — AB (ref 11.5–14.5)
WBC: 9.8 10*3/uL (ref 3.6–11.0)

## 2015-05-24 MED ORDER — ONDANSETRON HCL 4 MG/2ML IJ SOLN: 4.0000 mg | INTRAMUSCULAR | Status: DC | PRN

## 2015-05-24 MED ORDER — IBUPROFEN 600 MG PO TABS
600.0000 mg | ORAL_TABLET | Freq: Four times a day (QID) | ORAL | Status: DC
  Administered 2015-05-24 – 2015-05-25 (×5): 600 mg via ORAL
  Filled 2015-05-24 (×5): qty 1

## 2015-05-24 MED ORDER — ZOLPIDEM TARTRATE 5 MG PO TABS: 5.0000 mg | ORAL_TABLET | Freq: Every evening | ORAL | Status: DC | PRN

## 2015-05-24 MED ORDER — DIPHENHYDRAMINE HCL 25 MG PO CAPS: 25.0000 mg | ORAL_CAPSULE | Freq: Four times a day (QID) | ORAL | Status: DC | PRN

## 2015-05-24 MED ORDER — ONDANSETRON HCL 4 MG PO TABS: 4.0000 mg | ORAL_TABLET | ORAL | Status: DC | PRN

## 2015-05-24 MED ORDER — AMMONIA AROMATIC IN INHA
RESPIRATORY_TRACT | Status: AC
  Filled 2015-05-24: qty 10

## 2015-05-24 MED ORDER — DIBUCAINE 1 % RE OINT: 1.0000 "application " | TOPICAL_OINTMENT | RECTAL | Status: DC | PRN

## 2015-05-24 MED ORDER — PRENATAL MULTIVITAMIN CH
1.0000 | ORAL_TABLET | Freq: Every day | ORAL | Status: DC
  Administered 2015-05-24 – 2015-05-25 (×2): 1 via ORAL
  Filled 2015-05-24 (×3): qty 1

## 2015-05-24 MED ORDER — MISOPROSTOL 200 MCG PO TABS
ORAL_TABLET | ORAL | Status: AC
  Filled 2015-05-24: qty 4

## 2015-05-24 MED ORDER — LIDOCAINE HCL (PF) 1 % IJ SOLN
INTRAMUSCULAR | Status: AC
  Filled 2015-05-24: qty 30

## 2015-05-24 MED ORDER — DOCUSATE SODIUM 100 MG PO CAPS
100.0000 mg | ORAL_CAPSULE | Freq: Two times a day (BID) | ORAL | Status: DC
  Administered 2015-05-24 – 2015-05-25 (×2): 100 mg via ORAL
  Filled 2015-05-24 (×2): qty 1

## 2015-05-24 MED ORDER — COCONUT OIL OIL
1.0000 "application " | TOPICAL_OIL | Status: DC | PRN
  Administered 2015-05-24: 1 via TOPICAL
  Filled 2015-05-24: qty 120

## 2015-05-24 MED ORDER — SENNOSIDES-DOCUSATE SODIUM 8.6-50 MG PO TABS
2.0000 | ORAL_TABLET | ORAL | Status: DC
  Administered 2015-05-25: 2 via ORAL
  Filled 2015-05-24: qty 2

## 2015-05-24 MED ORDER — WITCH HAZEL-GLYCERIN EX PADS: 1.0000 "application " | MEDICATED_PAD | CUTANEOUS | Status: DC | PRN

## 2015-05-24 MED ORDER — ACETAMINOPHEN 325 MG PO TABS: 650.0000 mg | ORAL_TABLET | ORAL | Status: DC | PRN

## 2015-05-24 MED ORDER — BENZOCAINE-MENTHOL 20-0.5 % EX AERO
1.0000 "application " | INHALATION_SPRAY | CUTANEOUS | Status: DC | PRN
  Administered 2015-05-24 – 2015-05-25 (×2): 1 via TOPICAL
  Filled 2015-05-24 (×2): qty 56

## 2015-05-24 MED ORDER — SIMETHICONE 80 MG PO CHEW: 80.0000 mg | CHEWABLE_TABLET | ORAL | Status: DC | PRN

## 2015-05-24 MED ORDER — OXYTOCIN 10 UNIT/ML IJ SOLN
INTRAMUSCULAR | Status: AC
  Filled 2015-05-24: qty 2

## 2015-05-24 NOTE — Progress Notes (Signed)
Ashley Barrett is a 35 y.o. G3P2002 at [redacted]w[redacted]d by LMP admitted for induction of labor due to Hydramnios, Elective at term and LLP.  Subjective: Managing pain with contractions well, rates a 5 on pain scale 0-10  Objective: BP 115/73 mmHg  Pulse 78  Temp(Src) 98.2 F (36.8 C) (Oral)  Resp 17  Ht 5\' 6"  (1.676 m)  Wt 171 lb (77.565 kg)  BMI 27.61 kg/m2  LMP 08/23/2014      Fetal Wellbeing:  Category I UC:   irregular, every 2-3 minutes on 70mu/min pitocin SVE:   5/80/-1 AROM with copious amounts clear fluid and small amount bleeding (1 plum size clot noted in toilet) Labs: Lab Results  Component Value Date   WBC 9.8 05/24/2015   HGB 12.6 05/24/2015   HCT 37.3 05/24/2015   MCV 87.6 05/24/2015   PLT 135* 05/24/2015    Assessment / Plan: Induction of labor due to LLP, Polyhydramnious, & prev. LGA,  progressing well on pitocin  Labor: progressing well on pitocin Preeclampsia:  labs stable Pain Control:  Labor support without medications Anticipated MOD:  NSVD  Printice Hellmer Rockney Ghee, CNM 05/24/2015, 7:41 AM

## 2015-05-24 NOTE — Plan of Care (Signed)
Pt transferred via wheelchair to 339 in stable condition with family members at side. Pt voided small amount urine prior to transfer with pericare provided.  Report to [facility] on [place].  Lenore Cordia RNC

## 2015-05-24 NOTE — Progress Notes (Signed)
Ashley Barrett is a 35 y.o. G3P2002 at [redacted]w[redacted]d by LMP admitted for induction of labor due to Hydramnios and LLP.  Subjective: Feels pressure with each contraction  Objective: BP 120/65 mmHg  Pulse 59  Temp(Src) 98.1 F (36.7 C) (Oral)  Resp 17  Ht 5\' 6"  (1.676 m)  Wt 171 lb (77.565 kg)  BMI 27.61 kg/m2  LMP 08/23/2014   Total I/O In: 250 [I.V.:250] Out: -   FHT:  FHR: 135 bpm, variability: moderate,  accelerations:  Present,  decelerations:  Absent UC:   regular, every 2-3 minutes on 57mu/min pitocin SVE:   Dilation: Lip/rim Effacement (%): 90 Station: 0 Exam by:: MS CNM  Labs: Lab Results  Component Value Date   WBC 9.8 05/24/2015   HGB 12.6 05/24/2015   HCT 37.3 05/24/2015   MCV 87.6 05/24/2015   PLT 135* 05/24/2015    Assessment / Plan: Induction of labor due to LLP and polyhydramnious, previous LGA,  progressing well on pitocin  Labor: Progressing normally Preeclampsia:  labs stable Fetal Wellbeing:  Category I Pain Control:  Labor support without medications I/D:  n/a Anticipated MOD:  NSVD  Melody N Shambley 05/24/2015, 11:10 AM

## 2015-05-24 NOTE — Progress Notes (Signed)
Ashley Barrett is a 35 y.o. G3P2002 at [redacted]w[redacted]d by LMP admitted for induction of labor due to Elective at term and LLP.  Subjective:   Objective: BP 115/73 mmHg  Pulse 78  Temp(Src) 98.2 F (36.8 C) (Oral)  Resp 17  Ht 5\' 6"  (1.676 m)  Wt 171 lb (77.565 kg)  BMI 27.61 kg/m2  LMP 08/23/2014      Fetal Wellbeing:  Category I UC:   irregular, every 3-4 minutes, on 73mu/min pitocin SVE:   4/80/-2, small amount dark red blood noted on exam, bulging BOW, with fetal head blottable Labs: Lab Results  Component Value Date   WBC 9.8 05/24/2015   HGB 12.6 05/24/2015   HCT 37.3 05/24/2015   MCV 87.6 05/24/2015   PLT 135* 05/24/2015    Assessment / Plan: Induction of labor due to LLP and previous LGA,  progressing well on pitocin  Labor: Progressing on Pitocin, will continue to increase then AROM Preeclampsia:  labs stable Pain Control:  Labor support without medications, up on birthing ball to facilitate fetal decent Anticipated MOD:  NSVD  Reeder Brisby N Jonnathan Birman,CNM 05/24/2015, 5:02 AM

## 2015-05-25 LAB — CBC
HEMATOCRIT: 29.3 % — AB (ref 35.0–47.0)
Hemoglobin: 9.8 g/dL — ABNORMAL LOW (ref 12.0–16.0)
MCH: 29.6 pg (ref 26.0–34.0)
MCHC: 33.4 g/dL (ref 32.0–36.0)
MCV: 88.5 fL (ref 80.0–100.0)
Platelets: 126 10*3/uL — ABNORMAL LOW (ref 150–440)
RBC: 3.31 MIL/uL — AB (ref 3.80–5.20)
RDW: 15.8 % — ABNORMAL HIGH (ref 11.5–14.5)
WBC: 11.9 10*3/uL — AB (ref 3.6–11.0)

## 2015-05-25 LAB — RPR: RPR Ser Ql: NONREACTIVE

## 2015-05-25 MED ORDER — FUSION PLUS PO CAPS: 1.0000 | ORAL_CAPSULE | Freq: Every day | ORAL | Status: DC

## 2015-05-25 MED ORDER — VITAMIN D3 125 MCG (5000 UT) PO CAPS: 1.0000 | ORAL_CAPSULE | Freq: Every day | ORAL | Status: DC

## 2015-05-25 MED ORDER — IBUPROFEN 800 MG PO TABS
800.0000 mg | ORAL_TABLET | Freq: Three times a day (TID) | ORAL | Status: DC | PRN
Start: 2015-05-25 — End: 2016-12-22

## 2015-05-25 MED ORDER — DOCUSATE SODIUM 100 MG PO CAPS: 100.0000 mg | ORAL_CAPSULE | Freq: Two times a day (BID) | ORAL | Status: DC

## 2015-05-25 NOTE — Progress Notes (Signed)
Patient discharged home with infant and significant other. Discharge instructions, prescriptions and follow up appointment given to and reviewed with patient by Alecia Lemming, RN. Patient verbalized understanding. Escorted out via wheelchair by Wyman Songster, RN.

## 2015-05-25 NOTE — Discharge Summary (Signed)
Obstetric Discharge Summary Reason for Admission: induction of labor Prenatal Procedures: ultrasound Intrapartum Procedures: spontaneous vaginal delivery and PPH due to uterine atony Postpartum Procedures: none Complications-Operative and Postpartum: 2nd degree perineal laceration HEMOGLOBIN  Date Value Ref Range Status  05/25/2015 9.8* 12.0 - 16.0 g/dL Final   HCT  Date Value Ref Range Status  05/25/2015 29.3* 35.0 - 47.0 % Final   HEMATOCRIT  Date Value Ref Range Status  03/05/2015 36.0 34.0 - 46.6 % Final    Physical Exam:  General: alert, cooperative, appears stated age, fatigued and pale Lochia: appropriate Uterine Fundus: firm Incision: NA DVT Evaluation: No evidence of DVT seen on physical exam. Negative Homan's sign. Calf/Ankle edema is present.  Discharge Diagnoses: Term Pregnancy-delivered and PPH with subsequent anemia  Discharge Information: Date: 05/25/2015 Activity: pelvic rest Diet: routine Medications: None, Ibuprofen, Colace, Iron and Vit D3 Condition: stable Instructions: refer to practice specific booklet Discharge to: home   Newborn Data: Live born female Ashley Barrett Birth Weight: 6 lb 11.6 oz (3050 g) APGAR: 8, 9  Home with mother.  Ashley Barrett 05/25/2015, 10:57 AM

## 2015-06-13 ENCOUNTER — Other Ambulatory Visit: Payer: Self-pay | Admitting: Obstetrics and Gynecology

## 2015-06-13 MED ORDER — NORETHINDRONE 0.35 MG PO TABS: 1.0000 | ORAL_TABLET | Freq: Every day | ORAL | Status: DC

## 2015-07-24 ENCOUNTER — Ambulatory Visit (INDEPENDENT_AMBULATORY_CARE_PROVIDER_SITE_OTHER): Payer: BLUE CROSS/BLUE SHIELD | Admitting: Obstetrics and Gynecology

## 2015-07-24 ENCOUNTER — Encounter: Payer: Self-pay | Admitting: Obstetrics and Gynecology

## 2015-07-24 NOTE — Progress Notes (Signed)
  Subjective:     Ashley Barrett is a 35 y.o. female who presents for a postpartum visit. She is 8 weeks postpartum following a spontaneous vaginal delivery. I have fully reviewed the prenatal and intrapartum course. The delivery was at 40 gestational weeks. Outcome: spontaneous vaginal delivery. Anesthesia: local. Postpartum course has been uncomplicated. Baby's course has been uncomplicated. Baby is feeding by breast. Bleeding no bleeding. Bowel function is normal. Bladder function is normal. Patient is not sexually active. Contraception method is abstinence and oral progesterone-only contraceptive. Postpartum depression screening: negative.  The following portions of the patient's history were reviewed and updated as appropriate: allergies, current medications, past family history, past medical history, past social history, past surgical history and problem list.  Review of Systems A comprehensive review of systems was negative.   Objective:    There were no vitals taken for this visit.  General:  alert, cooperative and appears stated age   Breasts:  inspection negative, no nipple discharge or bleeding, no masses or nodularity palpable  Lungs: clear to auscultation bilaterally  Heart:  regular rate and rhythm, S1, S2 normal, no murmur, click, rub or gallop  Abdomen: soft, non-tender; bowel sounds normal; no masses,  no organomegaly   Vulva:  normal  Vagina: normal vagina, no discharge, exudate, lesion, or erythema  Cervix:  multiparous appearance  Corpus: normal size, contour, position, consistency, mobility, non-tender  Adnexa:  normal adnexa and no mass, fullness, tenderness  Rectal Exam: Not performed.        Assessment:     8 weeks postpartum exam. Pap smear not done at today's visit.   Plan:    1. Contraception: vasectomy 2. Breastfeeding amenorrhea 3. Follow up in: 3 months or as needed.

## 2015-07-24 NOTE — Patient Instructions (Signed)
  Place postpartum visit patient instructions here.  

## 2015-07-25 LAB — CBC
HEMATOCRIT: 40.1 % (ref 34.0–46.6)
HEMOGLOBIN: 13.2 g/dL (ref 11.1–15.9)
MCH: 28.7 pg (ref 26.6–33.0)
MCHC: 32.9 g/dL (ref 31.5–35.7)
MCV: 87 fL (ref 79–97)
Platelets: 199 10*3/uL (ref 150–379)
RBC: 4.6 x10E6/uL (ref 3.77–5.28)
RDW: 14.5 % (ref 12.3–15.4)
WBC: 5.7 10*3/uL (ref 3.4–10.8)

## 2015-07-25 LAB — IRON: IRON: 110 ug/dL (ref 27–159)

## 2015-07-25 LAB — VITAMIN D 25 HYDROXY (VIT D DEFICIENCY, FRACTURES): VIT D 25 HYDROXY: 49.2 ng/mL (ref 30.0–100.0)

## 2015-09-04 ENCOUNTER — Other Ambulatory Visit: Payer: Self-pay | Admitting: Obstetrics and Gynecology

## 2015-09-04 MED ORDER — CLINDAMYCIN PHOSPHATE 1 % EX GEL: Freq: Two times a day (BID) | CUTANEOUS | 11 refills | Status: DC

## 2016-12-22 ENCOUNTER — Other Ambulatory Visit: Payer: Self-pay | Admitting: Obstetrics and Gynecology

## 2016-12-22 ENCOUNTER — Ambulatory Visit (INDEPENDENT_AMBULATORY_CARE_PROVIDER_SITE_OTHER): Payer: BLUE CROSS/BLUE SHIELD | Admitting: Obstetrics and Gynecology

## 2016-12-22 ENCOUNTER — Encounter: Payer: Self-pay | Admitting: Obstetrics and Gynecology

## 2016-12-22 VITALS — BP 113/75 | HR 69 | Ht 66.0 in | Wt 156.9 lb

## 2016-12-22 DIAGNOSIS — N644 Mastodynia: Secondary | ICD-10-CM

## 2016-12-22 DIAGNOSIS — E7800 Pure hypercholesterolemia, unspecified: Secondary | ICD-10-CM | POA: Diagnosis not present

## 2016-12-22 DIAGNOSIS — Z803 Family history of malignant neoplasm of breast: Secondary | ICD-10-CM | POA: Diagnosis not present

## 2016-12-22 DIAGNOSIS — R12 Heartburn: Secondary | ICD-10-CM | POA: Diagnosis not present

## 2016-12-22 DIAGNOSIS — R0789 Other chest pain: Secondary | ICD-10-CM

## 2016-12-22 DIAGNOSIS — F329 Major depressive disorder, single episode, unspecified: Secondary | ICD-10-CM

## 2016-12-22 DIAGNOSIS — F32A Depression, unspecified: Secondary | ICD-10-CM

## 2016-12-22 MED ORDER — PANTOPRAZOLE SODIUM 20 MG PO TBEC: 20.0000 mg | DELAYED_RELEASE_TABLET | Freq: Every day | ORAL | 6 refills | Status: DC

## 2016-12-22 MED ORDER — FLUOXETINE HCL 10 MG PO CAPS: 10.0000 mg | ORAL_CAPSULE | Freq: Every day | ORAL | 3 refills | Status: DC

## 2016-12-22 NOTE — Progress Notes (Signed)
Subjective:     Patient ID: Ashley Barrett, female   DOB: 1980/06/06, 36 y.o.   MRN: [id number]  HPI   Breast felt knotty and rope like and very sensitive since stopping breast feeding 10 months ago. Concerned as she has a family history of maternal aunt with breast cancer at age 44. Does report increased caffeine intake to stay awake.  Reports increased chest pressure feels like a dull pressure in center of chest. Has heartburn but this is different.   Feels like under a lot of stress.  Tearful all the time, as she is going through a custody battle with oldest child's father. Not sleeping well. 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  Review of Systems Negative except stated above in HPI.    Objective:   Physical Exam A&Ox4 Well groomed female in no distress Blood pressure 113/75, pulse 69, height '5\' 6"'$  (1.676 m), weight 156 lb 14.4 oz (71.2 kg), last menstrual period 12/15/2016, currently breastfeeding. Body mass index is 25.32 kg/m.  HRR Lungs clear Breasts: breasts appear normal, no suspicious masses, no skin or nipple changes or axillary nodes, symmetric fibrous changes in both upper outer quadrants.     Assessment:     Bilateral breast pain Chest pressure Depression with anxiety Fatigue Family history of breast cancer in maternal aunt Previous elevated cholesterol Heartburn     Plan:     Labs obtained- will follow up accordingly Diagnostic mammogram ordered Discussed reducing caffeine intake & increasing stress relievors. Discussed depression and anxiety and is willing to try a SSRI- prozac '10mg'$  prescribed and will recheck in 5 weeks. Also will start daily protonix '20mg'$  before dinner.  RTC in 5 weeks or as needed for med check and annual as it is past due.   Tory Septer,CNM

## 2016-12-23 LAB — THYROID PANEL WITH TSH
FREE THYROXINE INDEX: 1.9 (ref 1.2–4.9)
T3 UPTAKE RATIO: 28 % (ref 24–39)
T4 TOTAL: 6.8 ug/dL (ref 4.5–12.0)
TSH: 2.61 u[IU]/mL (ref 0.450–4.500)

## 2016-12-23 LAB — COMPREHENSIVE METABOLIC PANEL
ALBUMIN: 4.4 g/dL (ref 3.5–5.5)
ALT: 9 IU/L (ref 0–32)
AST: 14 IU/L (ref 0–40)
Albumin/Globulin Ratio: 1.9 (ref 1.2–2.2)
Alkaline Phosphatase: 78 IU/L (ref 39–117)
BUN / CREAT RATIO: 23 (ref 9–23)
BUN: 16 mg/dL (ref 6–20)
CALCIUM: 9.3 mg/dL (ref 8.7–10.2)
CHLORIDE: 104 mmol/L (ref 96–106)
CO2: 24 mmol/L (ref 20–29)
CREATININE: 0.7 mg/dL (ref 0.57–1.00)
GFR, EST AFRICAN AMERICAN: 131 mL/min/{1.73_m2} (ref >59)
GFR, EST NON AFRICAN AMERICAN: 113 mL/min/{1.73_m2} (ref >59)
GLUCOSE: 83 mg/dL (ref 65–99)
Globulin, Total: 2.3 g/dL (ref 1.5–4.5)
Potassium: 4.1 mmol/L (ref 3.5–5.2)
Sodium: 142 mmol/L (ref 134–144)
TOTAL PROTEIN: 6.7 g/dL (ref 6.0–8.5)

## 2016-12-23 LAB — LIPID PANEL
Chol/HDL Ratio: 3.8 ratio (ref 0.0–4.4)
Cholesterol, Total: 162 mg/dL (ref 100–199)
HDL: 43 mg/dL (ref >39)
LDL Calculated: 87 mg/dL (ref 0–99)
Triglycerides: 160 mg/dL — ABNORMAL HIGH (ref 0–149)
VLDL Cholesterol Cal: 32 mg/dL (ref 5–40)

## 2016-12-23 LAB — FOLATE: Folate: 17.5 ng/mL (ref >3.0)

## 2016-12-23 LAB — IRON AND TIBC
IRON SATURATION: 10 % — AB (ref 15–55)
IRON: 32 ug/dL (ref 27–159)
Total Iron Binding Capacity: 309 ug/dL (ref 250–450)
UIBC: 277 ug/dL (ref 131–425)

## 2016-12-23 LAB — FERRITIN: FERRITIN: 39 ng/mL (ref 15–150)

## 2016-12-27 ENCOUNTER — Telehealth: Payer: Self-pay | Admitting: *Deleted

## 2016-12-27 NOTE — Telephone Encounter (Signed)
-----   Message from Joylene Igo, [place] sent at 12/24/2016  5:09 PM EST ----- Please let her know labs look fine, no worries.

## 2016-12-27 NOTE — Telephone Encounter (Signed)
Notified pt of labs.

## 2017-02-01 ENCOUNTER — Encounter: Payer: BLUE CROSS/BLUE SHIELD | Admitting: Obstetrics and Gynecology

## 2017-04-11 ENCOUNTER — Other Ambulatory Visit: Payer: Self-pay | Admitting: Obstetrics and Gynecology

## 2017-04-11 DIAGNOSIS — Z1231 Encounter for screening mammogram for malignant neoplasm of breast: Secondary | ICD-10-CM

## 2017-04-20 ENCOUNTER — Ambulatory Visit (INDEPENDENT_AMBULATORY_CARE_PROVIDER_SITE_OTHER): Payer: BLUE CROSS/BLUE SHIELD | Admitting: Obstetrics and Gynecology

## 2017-04-20 ENCOUNTER — Other Ambulatory Visit: Payer: Self-pay | Admitting: Obstetrics and Gynecology

## 2017-04-20 ENCOUNTER — Encounter: Payer: Self-pay | Admitting: Obstetrics and Gynecology

## 2017-04-20 VITALS — BP 121/78 | HR 75 | Ht 66.0 in | Wt 159.2 lb

## 2017-04-20 DIAGNOSIS — Z803 Family history of malignant neoplasm of breast: Secondary | ICD-10-CM

## 2017-04-20 DIAGNOSIS — Z01419 Encounter for gynecological examination (general) (routine) without abnormal findings: Secondary | ICD-10-CM | POA: Diagnosis not present

## 2017-04-20 DIAGNOSIS — R12 Heartburn: Secondary | ICD-10-CM

## 2017-04-20 NOTE — Patient Instructions (Addendum)
Vit E 800IU daily for breast tenderness around menses  Preventive Care 18-39 Years, Female Preventive care refers to lifestyle choices and visits with your health care provider that can promote health and wellness. What does preventive care include?  A yearly physical exam. This is also called an annual well check.  Dental exams once or twice a year.  Routine eye exams. Ask your health care provider how often you should have your eyes checked.  Personal lifestyle choices, including: ? Daily care of your teeth and gums. ? Regular physical activity. ? Eating a healthy diet. ? Avoiding tobacco and drug use. ? Limiting alcohol use. ? Practicing safe sex. ? Taking vitamin and mineral supplements as recommended by your health care provider. What happens during an annual well check? The services and screenings done by your health care provider during your annual well check will depend on your age, overall health, lifestyle risk factors, and family history of disease. Counseling Your health care provider may ask you questions about your:  Alcohol use.  Tobacco use.  Drug use.  Emotional well-being.  Home and relationship well-being.  Sexual activity.  Eating habits.  Work and work Statistician.  Method of birth control.  Menstrual cycle.  Pregnancy history.  Screening You may have the following tests or measurements:  Height, weight, and BMI.  Diabetes screening. This is done by checking your blood sugar (glucose) after you have not eaten for a while (fasting).  Blood pressure.  Lipid and cholesterol levels. These may be checked every 5 years starting at age 74.  Skin check.  Hepatitis C blood test.  Hepatitis B blood test.  Sexually transmitted disease (STD) testing.  BRCA-related cancer screening. This may be done if you have a family history of breast, ovarian, tubal, or peritoneal cancers.  Pelvic exam and Pap test. This may be done every 3 years  starting at age 72. Starting at age 23, this may be done every 5 years if you have a Pap test in combination with an HPV test.  Discuss your test results, treatment options, and if necessary, the need for more tests with your health care provider. Vaccines Your health care provider may recommend certain vaccines, such as:  Influenza vaccine. This is recommended every year.  Tetanus, diphtheria, and acellular pertussis (Tdap, Td) vaccine. You may need a Td booster every 10 years.  Varicella vaccine. You may need this if you have not been vaccinated.  HPV vaccine. If you are 54 or younger, you may need three doses over 6 months.  Measles, mumps, and rubella (MMR) vaccine. You may need at least one dose of MMR. You may also need a second dose.  Pneumococcal 13-valent conjugate (PCV13) vaccine. You may need this if you have certain conditions and were not previously vaccinated.  Pneumococcal polysaccharide (PPSV23) vaccine. You may need one or two doses if you smoke cigarettes or if you have certain conditions.  Meningococcal vaccine. One dose is recommended if you are age 7-21 years and a first-year college student living in a residence hall, or if you have one of several medical conditions. You may also need additional booster doses.  Hepatitis A vaccine. You may need this if you have certain conditions or if you travel or work in places where you may be exposed to hepatitis A.  Hepatitis B vaccine. You may need this if you have certain conditions or if you travel or work in places where you may be exposed to hepatitis B.  Haemophilus  influenzae type b (Hib) vaccine. You may need this if you have certain risk factors.  Talk to your health care provider about which screenings and vaccines you need and how often you need them. This information is not intended to replace advice given to you by your health care provider. Make sure you discuss any questions you have with your health care  provider. Document Released: 03/09/2001 Document Revised: 10/01/2015 Document Reviewed: 11/12/2014 Elsevier Interactive Patient Education  [employer].

## 2017-04-20 NOTE — Progress Notes (Signed)
Subjective:   Ashley Barrett is a 37 y.o. [id number] Caucasian female here for a routine well-woman exam.  Patient's last menstrual period was 04/07/2017.    Current complaints: HEARTBURN SEVERE COUPLE TIMES A WEEK PCP: me       doesn't desire labs  Social History: Sexual: heterosexual Marital Status: married Living situation: with family Occupation: Therapist, sports Tobacco/alcohol: no tobacco use Illicit drugs: no history of illicit drug use  The following portions of the patient's history were reviewed and updated as appropriate: allergies, current medications, past family history, past medical history, past social history, past surgical history and problem list.  Past Medical History History reviewed. No pertinent past medical history.  Past Surgical History History reviewed. No pertinent surgical history.  Gynecologic History [id number]  Patient's last menstrual period was 04/07/2017. Contraception: vasectomy Last Pap: 2017. Results were: normal   Obstetric History OB History  Gravida Para Term Preterm AB Living  3 2 2  0 0 2  SAB TAB Ectopic Multiple Live Births  0 0 0   2    # Outcome Date GA Lbr Len/2nd Weight Sex Delivery Anes PTL Lv  3 Gravida           2 Term 2012    M Vag-Spont   LIV  1 Term 2008    M Vag-Spont   LIV    Current Medications Current Outpatient Medications on File Prior to Visit  Medication Sig Dispense Refill  . pantoprazole (PROTONIX) 20 MG tablet Take 1 tablet (20 mg total) by mouth daily. 30 tablet 6  . Prenatal Vit-Fe Fumarate-FA (PRENATAL MULTIVITAMIN) TABS tablet Take 1 tablet by mouth daily at 12 noon.    . Cholecalciferol (VITAMIN D3) 5000 units CAPS Take 1 capsule (5,000 Units total) by mouth daily. (Patient not taking: Reported on 12/22/2016) 120 capsule 2  . clindamycin (CLINDAGEL) 1 % gel Apply topically 2 (two) times daily. (Patient not taking: Reported on 12/22/2016) 60 g 11   No current facility-administered medications on file prior to visit.      Review of Systems Patient denies any headaches, blurred vision, shortness of breath, chest pain, abdominal pain, problems with bowel movements, urination, or intercourse.  Objective:  BP 121/78   Pulse 75   Ht 5\' 6"  (1.676 m)   Wt 159 lb 3.2 oz (72.2 kg)   LMP 04/07/2017   BMI 25.70 kg/m  Physical Exam  General:  Well developed, well nourished, no acute distress. She is alert and oriented x3. Skin:  Warm and dry Neck:  Midline trachea, no thyromegaly or nodules Cardiovascular: Regular rate and rhythm, no murmur heard Lungs:  Effort normal, all lung fields clear to auscultation bilaterally Breasts:  No dominant palpable mass, retraction, or nipple discharge Abdomen:  Soft, non tender, no hepatosplenomegaly or masses Pelvic:  External genitalia is normal in appearance.  The vagina is normal in appearance. The cervix is bulbous, no CMT.  Thin prep pap is done with HR HPV cotesting. Uterus is felt to be normal size, shape, and contour.  No adnexal masses or tenderness noted. Extremities:  No swelling or varicosities noted Psych:  She has a normal mood and affect  Assessment:   Healthy well-woman exam Heartburn Family history of breast & lungs cancer           Plan:  invetea lab obtained.  Encouraged to take heartburn medication regular F/U 1 year for AE, or sooner if needed Mammogram scheduled next week.  Alfred Eckley Rockney Ghee, CNM

## 2017-04-22 LAB — CYTOLOGY - PAP

## 2017-04-27 ENCOUNTER — Encounter: Payer: Self-pay | Admitting: Radiology

## 2017-04-27 ENCOUNTER — Ambulatory Visit
Admission: RE | Admit: 2017-04-27 | Discharge: 2017-04-27 | Disposition: A | Discharge status: Home / Self Care | Payer: BLUE CROSS/BLUE SHIELD | Source: Ambulatory Visit | Attending: Obstetrics and Gynecology | Admitting: Obstetrics and Gynecology

## 2017-04-27 DIAGNOSIS — Z1231 Encounter for screening mammogram for malignant neoplasm of breast: Secondary | ICD-10-CM

## 2017-06-08 ENCOUNTER — Other Ambulatory Visit: Payer: Self-pay | Admitting: Obstetrics and Gynecology

## 2017-06-08 ENCOUNTER — Encounter: Payer: Self-pay | Admitting: Obstetrics and Gynecology

## 2017-06-08 ENCOUNTER — Ambulatory Visit (INDEPENDENT_AMBULATORY_CARE_PROVIDER_SITE_OTHER): Payer: BLUE CROSS/BLUE SHIELD | Admitting: Obstetrics and Gynecology

## 2017-06-08 VITALS — BP 121/67 | HR 80 | Ht 66.0 in | Wt 159.2 lb

## 2017-06-08 DIAGNOSIS — R87613 High grade squamous intraepithelial lesion on cytologic smear of cervix (HGSIL): Secondary | ICD-10-CM

## 2017-06-08 NOTE — Progress Notes (Signed)
[facility] COLPOSCOPY PROCEDURE NOTE  37 y.o. [id number] here for colposcopy for high-grade squamous intraepithelial neoplasia  (HGSIL-encompassing moderate and severe dysplasia) pap smear on 04/20/17. Discussed role for HPV in cervical dysplasia, need for surveillance.  Patient given informed consent, signed copy in the chart, time out was performed.  Placed in lithotomy position. Cervix viewed with speculum and colposcope after application of acetic acid.   Colposcopy adequate? Yes  visible lesion(s) at around entire cervix with mosaicism noted at 7 & 10 o'clock; corresponding biopsies obtained.  ECC specimen obtained. All specimens were labelled and sent to pathology. See scanned colpo sheet with detailed drawing.   Patient was given post procedure instructions.  Will follow up pathology and manage accordingly.  Routine preventative health maintenance measures emphasized.     Ashley Barrett, CNM

## 2017-06-08 NOTE — Patient Instructions (Signed)
Colposcopy, Care After  This sheet gives you information about how to care for yourself after your procedure. Your doctor may also give you more specific instructions. If you have problems or questions, contact your doctor.  What can I expect after the procedure?  If you did not have a tissue sample removed (did not have a biopsy), you may only have some spotting for a few days. You can go back to your normal activities.  If you had a tissue sample removed, it is common to have:  · Soreness and pain. This may last for a few days.  · Light-headedness.  · Mild bleeding from your vagina or dark-colored, grainy discharge from your vagina. This may last for a few days. You may need to wear a sanitary pad.  · Spotting for at least 48 hours after the procedure.    Follow these instructions at home:  · Take over-the-counter and prescription medicines only as told by your doctor. Ask your doctor what medicines you can start taking again. This is very important if you take blood-thinning medicine.  · Do not drive or use heavy machinery while taking prescription pain medicine.  · For 3 days, or as long as your doctor tells you, avoid:  ? Douching.  ? Using tampons.  ? Having sex.  · If you use birth control (contraception), keep using it.  · Limit activity for the first day after the procedure. Ask your doctor what activities are safe for you.  · It is up to you to get the results of your procedure. Ask your doctor when your results will be ready.  · Keep all follow-up visits as told by your doctor. This is important.  Contact a doctor if:  · You get a skin rash.  Get help right away if:  · You are bleeding a lot from your vagina. It is a lot of bleeding if you are using more than one pad an hour for 2 hours in a row.  · You have clumps of blood (blood clots) coming from your vagina.  · You have a fever.  · You have chills  · You have pain in your lower belly (pelvic area).  · You have signs of infection, such as vaginal  discharge that is:  ? Different than usual.  ? Yellow.  ? Bad-smelling.  · You have very pain or cramps in your lower belly that do not get better with medicine.  · You feel light-headed.  · You feel dizzy.  · You pass out (faint).  Summary  · If you did not have a tissue sample removed (did not have a biopsy), you may only have some spotting for a few days. You can go back to your normal activities.  · If you had a tissue sample removed, it is common to have mild pain and spotting for 48 hours.  · For 3 days, or as long as your doctor tells you, avoid douching, using tampons and having sex.  · Get help right away if you have bleeding, very bad pain, or signs of infection.  This information is not intended to replace advice given to you by your health care provider. Make sure you discuss any questions you have with your health care provider.  Document Released: 06/30/2007 Document Revised: 10/01/2015 Document Reviewed: 10/01/2015  Elsevier Interactive Patient Education © 2018 Elsevier Inc.

## 2017-06-16 ENCOUNTER — Encounter: Payer: Self-pay | Admitting: Obstetrics and Gynecology

## 2017-06-16 ENCOUNTER — Ambulatory Visit (INDEPENDENT_AMBULATORY_CARE_PROVIDER_SITE_OTHER): Payer: BLUE CROSS/BLUE SHIELD | Admitting: Obstetrics and Gynecology

## 2017-06-16 DIAGNOSIS — N871 Moderate cervical dysplasia: Secondary | ICD-10-CM

## 2017-06-16 MED ORDER — LORAZEPAM 0.5 MG PO TABS: 0.5000 mg | ORAL_TABLET | ORAL | 0 refills | Status: AC

## 2017-06-16 NOTE — Patient Instructions (Signed)
1.  LEEP cone biopsy is to be scheduled on a Monday within the next 4 to 6 weeks 2.  Ativan 0.5 mg orally is to be taken 1 to 2 hours prior to procedure.  Prescription has been sent to pharmacy   Cervical Conization Cervical conization (cone biopsy) is a procedure in which a cone-shaped portion of the cervix is cut out so that it can be examined under a microscope. The procedure is done to check for cancer cells or cells that might turn into cancer (precancerous cells). You may have this procedure if:  You have abnormal bleeding from your cervix.  You had an abnormal Pap test.  Something abnormal was seen on your cervix during an exam.  This procedure is performed in either a health care provider's office or in an operating room. Tell a health care provider about:  Any allergies you have.  All medicines you are taking, including vitamins, herbs, eye drops, creams, and over-the-counter medicines.  Any problems you or family members have had with the use of anesthetic medicines.  Any blood disorders you have.  Any surgeries you have had.  Any medical conditions you have.  Your smoking habits.  When you normally have your period.  Whether you are pregnant or may be pregnant. What are the risks? Generally, this is a safe procedure. However, problems may occur, including:  Heavy bleeding for several days or weeks after the procedure.  Allergic reactions to medicines or dyes.  Increased risk of preterm labor in future pregnancies.  Infection (rare).  Damage to the cervix or other structures or organs (rare).  What happens before the procedure? Staying hydrated Follow instructions from your health care provider about hydration, which may include:  Up to 2 hours before the procedure - you may continue to drink clear liquids, such as water, clear fruit juice, black coffee, and plain tea.  Eating and drinking restrictions Follow instructions from your health care provider  about eating and drinking, which may include:  8 hours before the procedure - stop eating heavy meals or foods such as meat, fried foods, or fatty foods.  6 hours before the procedure - stop eating light meals or foods, such as toast or cereal.  6 hours before the procedure - stop drinking milk or drinks that contain milk.  2 hours before the procedure - stop drinking clear liquids.  General instructions  Do not douche, have sex, use tampons, or use any vaginal medicines before the procedure as told by your health care provider.  You may be asked to empty your bladder and bowel right before the procedure.  Ask your health care provider about: ? Changing or stopping your normal medicines. This is important if you take diabetes medicines or blood thinners. ? Taking medicines such as aspirin and ibuprofen. These medicines can thin your blood. Do not take these medicines before your procedure if your doctor tells you not to.  Plan to have someone take you home from the hospital or clinic. What happens during the procedure?  To reduce your risk of infection: ? Your health care team will wash or sanitize their hands. ? Your skin will be washed with soap. ? Hair may be removed from the surgical area.  You will undress from the waist down and be given a gown to wear.  You will lie on an examining table and put your feet in stirrups.  An IV tube will be inserted into one of your veins.  You will  be given one or more of the following: ? A medicine to help you relax (sedative). ? A medicine to numb the area (local anesthetic). ? A medicine to make you fall asleep (general anesthetic). ? A medicine that numbs the cervix (cervical block).  A lubricated device called a speculum will be inserted into your vagina. It will be used to spread open the walls of the vagina so your health care provider can see the inside of the vagina and cervix better.  An instrument that has a magnifying lens  and a light (colposcope) will let your health care provider examine the cervix more closely.  Your health care provider will apply a solution to your cervix. This turns abnormal areas a pale color.  A tissue sample will be removed from the cervix using one of the following methods: ? The cold knife method. In this method, the tissue is cut out with a knife (scalpel). ? The loop electrosurgical excision procedure (LEEP) method. In this method, the tissue is cut out with a thin wire that can burn (cauterize) the tissue with an electrical current. ? Laser treatment method. In this method, the tissue is cut out and then cauterized with a laser beam to prevent bleeding.  Your health care provider will apply a paste over the biopsy areas to help control bleeding.  The tissue sample will be examined under a microscope. The procedure may vary among health care providers and hospitals. What happens after the procedure?  Your blood pressure, heart rate, breathing rate, and blood oxygen level will be monitored often until the medicines you were given have worn off.  If you were given a local anesthetic, you will rest at the clinic or hospital until you are stable and feel ready to go home.  If you were given a general anesthetic, you may be monitored for a longer period of time.  You may have some cramping.  You may have bloody discharge or light to moderate bleeding.  You may have dark discharge coming from your vagina. This is from the paste used on the cervix to prevent bleeding. Summary  Cervical conization is a procedure in which a cone-shaped portion of the cervix is cut out so that it can be examined under a microscope.  The procedure is done to check for cancer cells or cells that might turn into cancer (precancerous cells). This information is not intended to replace advice given to you by your health care provider. Make sure you discuss any questions you have with your health care  provider. Document Released: 10/21/2004 Document Revised: 01/14/2016 Document Reviewed: 01/14/2016 Elsevier Interactive Patient Education  2017 [employer].

## 2017-06-16 NOTE — Progress Notes (Signed)
Chief complaint: 1.  High-grade dysplasia on colposcopic rectal biopsies  Ashley Barrett presents today in company of her husband for conference to discuss management of her high-grade dysplasia of the cervix identified on colposcopic directed biopsies.  Colposcopy demonstrated 360 degrees of acetowhite epithelium. Biopsies: ECC-positive for high-grade dysplasia Cervix biopsy 10:00-CIN-2 to 3 without evidence of invasion Cervix biopsy 7:00 HPV changes  Evaluation and management of HPV and treatment with LEEP cone biopsy is explained.  Multiple questions are answered.  Success rates are reviewed.  Patient is desiring to proceed with LEEP cone biopsy in the office.  Ativan anxiolytic will be given prior to the procedure.  Prescription is sent to pharmacy.  ASSESSMENT: 1.  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  Positive ECC for high-grade dysplasia  PLAN: 1.  LEEP cone biopsy of the cervix is scheduled 2.  Ativan preoperative anxiolytic 0.5 mg p.o.  A total of 15 minutes were spent face-to-face with the patient during this encounter and over half of that time dealt with counseling and coordination of care.  Brayton Mars, MD  Note: This dictation was prepared with Dragon dictation along with smaller phrase technology. Any transcriptional errors that result from this process are unintentional.

## 2017-07-25 ENCOUNTER — Ambulatory Visit (INDEPENDENT_AMBULATORY_CARE_PROVIDER_SITE_OTHER): Payer: BLUE CROSS/BLUE SHIELD | Admitting: Obstetrics and Gynecology

## 2017-07-25 ENCOUNTER — Other Ambulatory Visit (HOSPITAL_COMMUNITY)
Admission: RE | Admit: 2017-07-25 | Discharge: 2017-07-25 | Disposition: A | Discharge status: Home / Self Care | Payer: BLUE CROSS/BLUE SHIELD | Source: Ambulatory Visit | Attending: Obstetrics and Gynecology | Admitting: Obstetrics and Gynecology

## 2017-07-25 ENCOUNTER — Encounter: Payer: Self-pay | Admitting: Obstetrics and Gynecology

## 2017-07-25 VITALS — BP 117/78 | HR 76 | Ht 66.0 in | Wt 161.7 lb

## 2017-07-25 DIAGNOSIS — D069 Carcinoma in situ of cervix, unspecified: Secondary | ICD-10-CM | POA: Insufficient documentation

## 2017-07-25 DIAGNOSIS — N871 Moderate cervical dysplasia: Secondary | ICD-10-CM

## 2017-07-25 NOTE — Addendum Note (Signed)
Addended by: Elouise Munroe on: 07/25/2017 03:51 PM   Modules accepted: Orders

## 2017-07-25 NOTE — Progress Notes (Signed)
OPERATIVE NOTE:  GERA INBODEN PROCEDURE DATE: 07/25/2017   PREOPERATIVE DIAGNOSIS: 1.  High-grade cervical dysplasia-CIN-2 2.  Positive ECC  POSTOPERATIVE DIAGNOSIS: 1.  High-grade cervical dysplasia-CIN-2 2.  Positive ECC  PROCEDURE: LEEP cone biopsy  SURGEON:  Brayton Mars, MD ASSISTANTS: None ANESTHESIA: Paracervical block INDICATIONS: 37 y.o. [id number] with CIN-2 of the cervix on colposcopic directed biopsies, as well as a positive ECC of high-grade dysplasia, presents for surgical management. Prior colposcopy demonstrated wide geographic area of acetowhite epithelium.  FINDINGS: Large parous cervix; Lugol's solution demonstrated nonstaining area of epithelium at the 12 o'clock position extending high on the ectocervix  SPECIMENS: 1.  Ectocervical LEEP 2.  Endocervical LEEP 3.  Post LEEP ECC  ANTIBIOTIC PROPHYLAXIS:N/A COMPLICATIONS: None immediate  PROCEDURE IN DETAIL: Verbal consent is obtained.  Patient is placed in the dorsolithotomy position.  A coated speculum was placed into the vagina to help facilitate visualization of the cervix and upper adjacent vagina.  Lugol solution is applied to help identify nonstaining areas that will be removed.  Paracervical block using 1% lidocaine with 1-100,000 epinephrine is injected at the 3:00 and 9:00 positions respectively.  Ectocervical LEEP is then performed using a wide loop, 49 W of power; endocervical LEEP was then performed with a narrow loop using the same power wattage.  Post LEEP ECC is performed with serrated curette.  The cone bed is cauterized using rollerball cautery.  Monsel's solution is applied to optimize hemostasis.  A large tag of nonstaining ectocervix is noted at the 12 o'clock position, and could not be incorporated into the LEEP specimen despite using the large wide loops.  This will be managed postoperatively through serial colposcopies.  Patient understands that the ectocervical abnormality will be  easily followed..  Should significant high-grade dysplasia be identified (CIN-3/CIS), patient will consider hysterectomy.  Breck [place] A. Zipporah Plants, MD, ACOG [facility]

## 2017-07-25 NOTE — Patient Instructions (Signed)

## 2017-08-01 ENCOUNTER — Encounter: Payer: BLUE CROSS/BLUE SHIELD | Admitting: Obstetrics and Gynecology

## 2017-08-18 ENCOUNTER — Encounter: Payer: BLUE CROSS/BLUE SHIELD | Admitting: Obstetrics and Gynecology

## 2017-09-07 ENCOUNTER — Encounter: Payer: Self-pay | Admitting: Obstetrics and Gynecology

## 2017-09-07 ENCOUNTER — Ambulatory Visit (INDEPENDENT_AMBULATORY_CARE_PROVIDER_SITE_OTHER): Payer: BLUE CROSS/BLUE SHIELD | Admitting: Obstetrics and Gynecology

## 2017-09-07 VITALS — BP 106/71 | HR 85 | Ht 66.0 in | Wt 160.6 lb

## 2017-09-07 DIAGNOSIS — N871 Moderate cervical dysplasia: Secondary | ICD-10-CM

## 2017-09-07 DIAGNOSIS — Z9889 Other specified postprocedural states: Secondary | ICD-10-CM

## 2017-09-07 NOTE — Patient Instructions (Signed)
1.  Return in 6 months for Pap smear. 2.  May resume all normal activities without restriction.

## 2017-09-07 NOTE — Progress Notes (Signed)
Chief complaint: 1.  Follow-up after LEEP. 2.  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  No active symptoms at this time.  She did experience.  Like bleeding for several weeks followed by watery discharge during the month.  No significant pelvic pain.  Pathology: Diagnosis 1. Endocervix, LEEP - BENIGN TRANSITIONAL ZONE MUCOSA. - THERE IS NO EVIDENCE OF MALIGNANCY. 2. Cervix, LEEP - HIGH GRADE SQUAMOUS INTRAEPITHELIAL LESION, CIN-III (SEVERE DYSPLASIA/CIS). - THE SURGICAL RESECTION MARGINS ARE ARE NEGATIVE FOR HIGH GRADE DYSPLASIA. - SEE COMMENT. 3. Endocervix, curettage - BENIGN PROLIFERATIVE-TYPE ENDOMETRIUM. - BENIGN ENDOCERVICAL-TYPE MUCOSA. - THERE IS NO EVIDENCE OF MALIGNANCY. Microscopic Comment 2. The findings correlate with the results of the patient's recent biopsy, [id number].  OBJECTIVE: BP 106/71   Pulse 85   Ht 5\' 6"  (1.676 m)   Wt 160 lb 9.6 oz (72.8 kg)   LMP 08/16/2017 (Approximate)   BMI 25.92 kg/m  Pleasant female no acute distress.  Alert and oriented. Pelvic exam: External genitalia-normal BUS-normal Vagina-normal Cervix-well-healed cervix, parous without evidence of inflammation; minimal bloody discharge (menses); no cervical motion tenderness  ASSESSMENT: 1.  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  Status post LEEP cone biopsy with clear margins  PLAN: 1.  Resume activities as tolerated 2.  Return in 6 months for Pap smear  Brayton Mars, MD  Note: This dictation was prepared with Dragon dictation along with smaller phrase technology. Any transcriptional errors that result from this process are unintentional.

## 2017-12-09 ENCOUNTER — Encounter: Payer: BLUE CROSS/BLUE SHIELD | Admitting: Obstetrics and Gynecology

## 2018-03-15 ENCOUNTER — Other Ambulatory Visit (HOSPITAL_COMMUNITY)
Admission: RE | Admit: 2018-03-15 | Discharge: 2018-03-15 | Disposition: A | Discharge status: Home / Self Care | Payer: BLUE CROSS/BLUE SHIELD | Source: Ambulatory Visit | Attending: Obstetrics and Gynecology | Admitting: Obstetrics and Gynecology

## 2018-03-15 ENCOUNTER — Encounter: Payer: Self-pay | Admitting: Obstetrics and Gynecology

## 2018-03-15 ENCOUNTER — Ambulatory Visit (INDEPENDENT_AMBULATORY_CARE_PROVIDER_SITE_OTHER): Payer: Self-pay | Admitting: Obstetrics and Gynecology

## 2018-03-15 VITALS — BP 128/90 | HR 98 | Ht 66.0 in | Wt 167.2 lb

## 2018-03-15 DIAGNOSIS — R87613 High grade squamous intraepithelial lesion on cytologic smear of cervix (HGSIL): Secondary | ICD-10-CM

## 2018-03-15 DIAGNOSIS — H938X2 Other specified disorders of left ear: Secondary | ICD-10-CM

## 2018-03-15 DIAGNOSIS — R059 Cough, unspecified: Secondary | ICD-10-CM

## 2018-03-15 DIAGNOSIS — R05 Cough: Secondary | ICD-10-CM

## 2018-03-15 MED ORDER — PREDNISONE 10 MG (21) PO TBPK: ORAL_TABLET | ORAL | 1 refills | Status: DC

## 2018-03-15 NOTE — Progress Notes (Signed)
  Subjective:     Patient ID: Ashley Barrett, female   DOB: 13-Sep-1980, 38 y.o.   MRN: [id number]  HPI Here for follow up pap after LEEP procedure done 07/2017. Denies any changes. Does request breast exam due to family history of breast cancer in maternal GM and maternal aunt.  Reports chronic cough for 6 weeks worse at night, denies fever or phlegm. Also notes left ear pressure like something is clogging ear canal. Tried decongestants with no relief.  Review of Systems  HENT: Positive for ear pain.   Respiratory: Positive for cough.   All other systems reviewed and are negative.      Objective:   Physical Exam A&Ox4 Well groomed female in no distress Blood pressure 128/90, pulse 98, height 5\' 6"  (1.676 m), weight 167 lb 3.2 oz (75.8 kg), last menstrual period 02/21/2018, currently not breastfeeding.  Body mass index is 26.99 kg/m.  Left ear with wax impacted- could not see past it to examine tympanic membrane Lungs clear bilaterally with dry cough noted. Breasts: breasts appear normal, no suspicious masses, no skin or nipple changes or axillary nodes, symmetric fibrous changes in both upper outer quadrants. Pelvic exam: normal external genitalia, vulva, vagina, cervix, uterus and adnexa.    Assessment:     H/O abnormal pap Fibrocystic breast bilaterally Left ear wax impaction cough    Plan:     Pap obtained-will follow up in 6 months per ASCCP recommendations. Recommend ear candle or ENT appt to disimpact left ear. predPack given .   Melody Shambley,CNM  Melody Shambley,CNM

## 2018-03-21 LAB — CYTOLOGY - PAP
Diagnosis: NEGATIVE
HPV (WINDOPATH): NOT DETECTED

## 2018-04-20 ENCOUNTER — Telehealth: Payer: Self-pay | Admitting: Obstetrics and Gynecology

## 2018-04-28 NOTE — Telephone Encounter (Signed)
Patient notified of updated [facility] results. Will review them(mailed her a copy) and is to contact the genetic counselor for more information.  Ashley Barrett,CNM

## 2018-09-13 ENCOUNTER — Encounter: Payer: PRIVATE HEALTH INSURANCE | Admitting: Obstetrics and Gynecology

## 2018-10-30 ENCOUNTER — Other Ambulatory Visit: Payer: Self-pay

## 2018-10-30 ENCOUNTER — Encounter: Payer: Self-pay | Admitting: Maternal Newborn

## 2018-10-30 ENCOUNTER — Other Ambulatory Visit (HOSPITAL_COMMUNITY)
Admission: RE | Admit: 2018-10-30 | Discharge: 2018-10-30 | Disposition: A | Discharge status: Home / Self Care | Payer: Self-pay | Source: Ambulatory Visit | Attending: Maternal Newborn | Admitting: Maternal Newborn

## 2018-10-30 ENCOUNTER — Ambulatory Visit (INDEPENDENT_AMBULATORY_CARE_PROVIDER_SITE_OTHER): Payer: PRIVATE HEALTH INSURANCE | Admitting: Maternal Newborn

## 2018-10-30 VITALS — BP 90/70 | HR 84 | Ht 65.0 in | Wt 148.0 lb

## 2018-10-30 DIAGNOSIS — Z124 Encounter for screening for malignant neoplasm of cervix: Secondary | ICD-10-CM

## 2018-10-30 DIAGNOSIS — R87619 Unspecified abnormal cytological findings in specimens from cervix uteri: Secondary | ICD-10-CM | POA: Insufficient documentation

## 2018-10-30 DIAGNOSIS — Z01419 Encounter for gynecological examination (general) (routine) without abnormal findings: Secondary | ICD-10-CM

## 2018-10-30 NOTE — Progress Notes (Signed)
Gynecology Annual Exam  PCP: Joylene Igo, CNM  Chief Complaint:  Chief Complaint  Patient presents with  . Gynecologic Exam    History of Present Illness: Patient is a 38 y.o. [id number] presenting for an annual exam. The patient has no complaints today.   LMP: Patient's last menstrual period was 10/04/2018 (approximate). Average Interval: regular, monthly Duration of flow: 4-5 days Heavy Menses: sometimes first day of cycle Clots: no Intermenstrual Bleeding: no Postcoital Bleeding: no Dysmenorrhea: no  The patient is sexually active. She currently uses vasectomy for contraception. She does not have dyspareunia.  The patient does perform self breast exams.  There is notable family history of breast cancer in her maternal aunt.  The patient wears seatbelts: yes.   The patient has regular exercise: yes.    The patient does not have current symptoms of depression.    Review of Systems  Constitutional: Negative.   HENT: Negative.   Eyes: Negative.   Respiratory: Negative for shortness of breath and wheezing.   Cardiovascular: Negative for chest pain and palpitations.  Gastrointestinal: Negative.  Negative for abdominal pain, constipation and diarrhea.  Genitourinary: Negative.   Musculoskeletal: Negative.   Skin: Negative.   Neurological: Negative.   Endo/Heme/Allergies: Positive for environmental allergies.  Psychiatric/Behavioral: Negative.   All other systems reviewed and are negative.   Past Medical History:  Past Medical History:  Diagnosis Date  . Abnormal Pap smear of cervix     Past Surgical History:  Past Surgical History:  Procedure Laterality Date  . WISDOM TOOTH EXTRACTION     one taken out - early 20s    Gynecologic History:  Patient's last menstrual period was 10/04/2018 (approximate). Contraception: vasectomy Last Pap: 03/15/2018. Results were: NIL and HR HPV negative  Following up today due to HGSIL/LEEP in July 2019.  Obstetric  History: [id number]  Family History:  Family History  Problem Relation Age of Onset  . Breast cancer Maternal Aunt   . Cancer Paternal Uncle 82       prostate  . Cancer Maternal Grandmother 23       lung  . Cancer Maternal Grandfather 69       lung    Social History:  Social History   Socioeconomic History  . Marital status: Married    Spouse name: Not on file  . Number of children: Not on file  . Years of education: Not on file  . Highest education level: Not on file  Occupational History  . Not on file  Social Needs  . Financial resource strain: Not on file  . Food insecurity    Worry: Not on file    Inability: Not on file  . Transportation needs    Medical: Not on file    Non-medical: Not on file  Tobacco Use  . Smoking status: Never Smoker  . Smokeless tobacco: Never Used  Substance and Sexual Activity  . Alcohol use: Yes    Comment: occas  . Drug use: No  . Sexual activity: Yes    Birth control/protection: Other-see comments    Comment: vasectomy  Lifestyle  . Physical activity    Days per week: Not on file    Minutes per session: Not on file  . Stress: Not on file  Relationships  . Social Herbalist on phone: Not on file    Gets together: Not on file    Attends religious service: Not on file    Active member  of club or organization: Not on file    Attends meetings of clubs or organizations: Not on file    Relationship status: Not on file  . Intimate partner violence    Fear of current or ex partner: Not on file    Emotionally abused: Not on file    Physically abused: Not on file    Forced sexual activity: Not on file  Other Topics Concern  . Not on file  Social History Narrative  . Not on file    Allergies:  Allergies  Allergen Reactions  . Other     seasonal    Medications: Prior to Admission medications   Medication Sig Start Date End Date Taking? Authorizing Provider  cetirizine (ZYRTEC) 10 MG tablet Take 10 mg by mouth  daily.   Yes [provider]  Multiple Vitamin (MULTIVITAMIN) capsule Take 1 capsule by mouth daily.   Yes [provider]  Red Yeast Rice 600 MG CAPS Take by mouth.   Yes [provider]  vitamin E (VITAMIN E) 400 UNIT capsule Take 400 Units by mouth daily.   Yes [provider]  Biotin 1000 MCG tablet Take 1,000 mcg by mouth 3 (three) times daily.    [provider]  predniSONE (STERAPRED UNI-PAK 21 TAB) 10 MG (21) TBPK tablet 3 tablets for 2 days, 2 tablets for 3 days, then one tablet daily Patient not taking: Reported on 10/30/2018 03/15/18   Joylene Igo, CNM    Physical Exam Vitals: Blood pressure 90/70, pulse 84, height 5\' 5"  (1.651 m), weight 148 lb (67.1 kg), last menstrual period 10/04/2018, currently breastfeeding.  General: NAD HEENT: normocephalic, anicteric Thyroid: no enlargement Pulmonary: No increased work of breathing, CTAB Cardiovascular: RRR, no murmurs, rubs, or gallops Breast: Breasts symmetrical, no tenderness, no palpable nodules or masses, no skin or nipple retraction present, no nipple discharge.  No axillary or supraclavicular lymphadenopathy. Abdomen: Soft, non-tender, non-distended.  Umbilicus without lesions.  No hepatomegaly, splenomegaly or masses palpable. No evidence of hernia  Genitourinary:  External: Normal external female genitalia.  Normal urethral  meatus, normal Bartholin's and Skene's glands.    Vagina: Normal vaginal mucosa, no evidence of prolapse.    Cervix: Grossly normal in appearance, no bleeding  Uterus: Non-enlarged, mobile, normal contour.  No CMT  Adnexa: ovaries non-enlarged, no adnexal masses  Rectal: deferred  Lymphatic: no evidence of inguinal lymphadenopathy Extremities: no edema, erythema, or tenderness Neurologic: Grossly intact Psychiatric: mood appropriate, affect full  Assessment: 38 y.o. [id number] here for annual exam and follow-up Pap  Plan: Problem List Items Addressed  This Visit    None    Visit Diagnoses    Women's annual routine gynecological examination    -  Primary   Encounter for Pap cervical smear following prior abnormal smear       Relevant Orders   Cytology - PAP      1) STI screening was offered and declined  2) ASCCP guidelines and rationale discussed.  Patient opts for yearly screening interval  3) Contraception - Vasectomy  4) Routine healthcare maintenance including cholesterol, diabetes screening discussed: Declines today  5) Follow up 1 year for an annual exam, or sooner if Pap results require closer follow-up.  Avel Sensor, CNM 10/30/2018  14:50

## 2018-11-01 ENCOUNTER — Encounter: Payer: Self-pay | Admitting: Maternal Newborn

## 2018-11-01 NOTE — Patient Instructions (Signed)
Preventive Care 21-36 Years Old, Female Preventive care refers to visits with your health care provider and lifestyle choices that can promote health and wellness. This includes:  A yearly physical exam. This may also be called an annual well check.  Regular dental visits and eye exams.  Immunizations.  Screening for certain conditions.  Healthy lifestyle choices, such as eating a healthy diet, getting regular exercise, not using drugs or products that contain nicotine and tobacco, and limiting alcohol use. What can I expect for my preventive care visit? Physical exam Your health care provider will check your:  Height and weight. This may be used to calculate body mass index (BMI), which tells if you are at a healthy weight.  Heart rate and blood pressure.  Skin for abnormal spots. Counseling Your health care provider may ask you questions about your:  Alcohol, tobacco, and drug use.  Emotional well-being.  Home and relationship well-being.  Sexual activity.  Eating habits.  Work and work environment.  Method of birth control.  Menstrual cycle.  Pregnancy history. What immunizations do I need?  Influenza (flu) vaccine  This is recommended every year. Tetanus, diphtheria, and pertussis (Tdap) vaccine  You may need a Td booster every 10 years. Varicella (chickenpox) vaccine  You may need this if you have not been vaccinated. Human papillomavirus (HPV) vaccine  If recommended by your health care provider, you may need three doses over 6 months. Measles, mumps, and rubella (MMR) vaccine  You may need at least one dose of MMR. You may also need a second dose. Meningococcal conjugate (MenACWY) vaccine  One dose is recommended if you are age 19-21 years and a first-year college student living in a residence hall, or if you have one of several medical conditions. You may also need additional booster doses. Pneumococcal conjugate (PCV13) vaccine  You may need  this if you have certain conditions and were not previously vaccinated. Pneumococcal polysaccharide (PPSV23) vaccine  You may need one or two doses if you smoke cigarettes or if you have certain conditions. Hepatitis A vaccine  You may need this if you have certain conditions or if you travel or work in places where you may be exposed to hepatitis A. Hepatitis B vaccine  You may need this if you have certain conditions or if you travel or work in places where you may be exposed to hepatitis B. Haemophilus influenzae type b (Hib) vaccine  You may need this if you have certain conditions. You may receive vaccines as individual doses or as more than one vaccine together in one shot (combination vaccines). Talk with your health care provider about the risks and benefits of combination vaccines. What tests do I need?  Blood tests  Lipid and cholesterol levels. These may be checked every 5 years starting at age 20.  Hepatitis C test.  Hepatitis B test. Screening  Diabetes screening. This is done by checking your blood sugar (glucose) after you have not eaten for a while (fasting).  Sexually transmitted disease (STD) testing.  BRCA-related cancer screening. This may be done if you have a family history of breast, ovarian, tubal, or peritoneal cancers.  Pelvic exam and Pap test. This may be done every 3 years starting at age 21. Starting at age 30, this may be done every 5 years if you have a Pap test in combination with an HPV test. Talk with your health care provider about your test results, treatment options, and if necessary, the need for more tests.   Follow these instructions at home: Eating and drinking   Eat a diet that includes fresh fruits and vegetables, whole grains, lean protein, and low-fat dairy.  Take vitamin and mineral supplements as recommended by your health care provider.  Do not drink alcohol if: ? Your health care provider tells you not to drink. ? You are  pregnant, may be pregnant, or are planning to become pregnant.  If you drink alcohol: ? Limit how much you have to 0-1 drink a day. ? Be aware of how much alcohol is in your drink. In the [place] one drink equals one 12 oz bottle of beer (355 mL), one 5 oz glass of wine (148 mL), or one 1 oz glass of hard liquor (44 mL). Lifestyle  Take daily care of your teeth and gums.  Stay active. Exercise for at least 30 minutes on 5 or more days each week.  Do not use any products that contain nicotine or tobacco, such as cigarettes, e-cigarettes, and chewing tobacco. If you need help quitting, ask your health care provider.  If you are sexually active, practice safe sex. Use a condom or other form of birth control (contraception) in order to prevent pregnancy and STIs (sexually transmitted infections). If you plan to become pregnant, see your health care provider for a preconception visit. What's next?  Visit your health care provider once a year for a well check visit.  Ask your health care provider how often you should have your eyes and teeth checked.  Stay up to date on all vaccines. This information is not intended to replace advice given to you by your health care provider. Make sure you discuss any questions you have with your health care provider. Document Released: 03/09/2001 Document Revised: 09/22/2017 Document Reviewed: 09/22/2017 Elsevier Patient Education  2020 Elsevier Inc.  

## 2018-11-07 LAB — CYTOLOGY - PAP
Adequacy: ABSENT
Diagnosis: NEGATIVE
High risk HPV: NEGATIVE

## 2019-11-01 ENCOUNTER — Ambulatory Visit: Payer: Self-pay | Admitting: Advanced Practice Midwife

## 2019-11-21 ENCOUNTER — Ambulatory Visit: Payer: Self-pay | Admitting: Advanced Practice Midwife

## 2019-12-27 ENCOUNTER — Encounter: Payer: Self-pay | Admitting: Advanced Practice Midwife

## 2019-12-27 ENCOUNTER — Ambulatory Visit (INDEPENDENT_AMBULATORY_CARE_PROVIDER_SITE_OTHER): Payer: Self-pay | Admitting: Advanced Practice Midwife

## 2019-12-27 ENCOUNTER — Other Ambulatory Visit: Payer: Self-pay

## 2019-12-27 VITALS — BP 118/74 | Ht 66.0 in | Wt 141.0 lb

## 2019-12-27 DIAGNOSIS — Z0000 Encounter for general adult medical examination without abnormal findings: Secondary | ICD-10-CM

## 2019-12-27 NOTE — Progress Notes (Signed)
Gynecology Annual Exam   PCP: Joylene Igo, CNM  Chief Complaint:  Chief Complaint  Patient presents with  . Annual Exam    History of Present Illness: Patient is a 39 y.o. G3P3003 presents for annual exam. The patient has no complaints today. She has had 2 normal PAP smears since her abnormal result.   LMP: Patient's last menstrual period was 11/29/2019. Average Interval: regular, 28 days Duration of flow: 5-6 days Heavy Menses: varies Clots: no Intermenstrual Bleeding: no Postcoital Bleeding: no Dysmenorrhea: no  The patient is sexually active. She currently uses vasectomy for contraception. She denies dyspareunia.  The patient does perform self breast exams.  There is notable family history of breast or ovarian cancer in her family. Her maternal aunt (< 59 years) and maternal grandmother had breast cancer. She declines MyRisk testing at this time  The patient wears seatbelts: yes.   The patient has regular exercise: yes She walks and bikes regularly.  She admits healthy diet, hydration and sleep.  The patient denies current symptoms of depression.    Review of Systems: Review of Systems  Constitutional: Negative for chills and fever.  HENT: Negative for congestion, ear discharge, ear pain, hearing loss, sinus pain and sore throat.   Eyes: Negative for blurred vision and double vision.  Respiratory: Negative for cough, shortness of breath and wheezing.   Cardiovascular: Negative for chest pain, palpitations and leg swelling.  Gastrointestinal: Negative for abdominal pain, blood in stool, constipation, diarrhea, heartburn, melena, nausea and vomiting.  Genitourinary: Negative for dysuria, flank pain, frequency, hematuria and urgency.  Musculoskeletal: Negative for back pain, joint pain and myalgias.  Skin: Negative for itching and rash.  Neurological: Negative for dizziness, tingling, tremors, sensory change, speech change, focal weakness, seizures, loss of  consciousness, weakness and headaches.  Endo/Heme/Allergies: Negative for environmental allergies. Does not bruise/bleed easily.  Psychiatric/Behavioral: Negative for depression, hallucinations, memory loss, substance abuse and suicidal ideas. The patient is not nervous/anxious and does not have insomnia.     Past Medical History:  Patient Active Problem List   Diagnosis Date Noted  . Status post LEEP (loop electrosurgical excision procedure) of cervix 09/07/2017  . Dysplasia of cervix, high grade CIN 2 06/16/2017    Diagnosis 1. Endocervix, LEEP - BENIGN TRANSITIONAL ZONE MUCOSA. - THERE IS NO EVIDENCE OF MALIGNANCY. 2. Cervix, LEEP - HIGH GRADE SQUAMOUS INTRAEPITHELIAL LESION, CIN-III (SEVERE DYSPLASIA/CIS). - THE SURGICAL RESECTION MARGINS ARE ARE NEGATIVE FOR HIGH GRADE DYSPLASIA. - SEE COMMENT. 3. Endocervix, curettage - BENIGN PROLIFERATIVE-TYPE ENDOMETRIUM. - BENIGN ENDOCERVICAL-TYPE MUCOSA. - THERE IS NO EVIDENCE OF MALIGNANCY.     Past Surgical History:  Past Surgical History:  Procedure Laterality Date  . WISDOM TOOTH EXTRACTION     one taken out - early 20s    Gynecologic History:  Patient's last menstrual period was 11/29/2019. Contraception: vasectomy Last Pap: 1 year ago Results were: no abnormalities   Obstetric History: [id number]  Family History:  Family History  Problem Relation Age of Onset  . Breast cancer Maternal Aunt   . Cancer Paternal Uncle 22       prostate  . Cancer Maternal Grandmother 93       lung  . Cancer Maternal Grandfather 18       lung    Social History:  Social History   Socioeconomic History  . Marital status: Married    Spouse name: Not on file  . Number of children: Not on file  . Years  of education: Not on file  . Highest education level: Not on file  Occupational History  . Not on file  Tobacco Use  . Smoking status: Never Smoker  . Smokeless tobacco: Never Used  Vaping Use  . Vaping Use: Never used  Substance  and Sexual Activity  . Alcohol use: Yes    Comment: occas  . Drug use: No  . Sexual activity: Yes    Birth control/protection: Other-see comments    Comment: vasectomy  Other Topics Concern  . Not on file  Social History Narrative  . Not on file   Social Determinants of Health   Financial Resource Strain:   . Difficulty of Paying Living Expenses: Not on file  Food Insecurity:   . Worried About Charity fundraiser in the Last Year: Not on file  . Ran Out of Food in the Last Year: Not on file  Transportation Needs:   . Lack of Transportation (Medical): Not on file  . Lack of Transportation (Non-Medical): Not on file  Physical Activity:   . Days of Exercise per Week: Not on file  . Minutes of Exercise per Session: Not on file  Stress:   . Feeling of Stress : Not on file  Social Connections:   . Frequency of Communication with Friends and Family: Not on file  . Frequency of Social Gatherings with Friends and Family: Not on file  . Attends Religious Services: Not on file  . Active Member of Clubs or Organizations: Not on file  . Attends Archivist Meetings: Not on file  . Marital Status: Not on file  Intimate Partner Violence:   . Fear of Current or Ex-Partner: Not on file  . Emotionally Abused: Not on file  . Physically Abused: Not on file  . Sexually Abused: Not on file    Allergies:  Allergies  Allergen Reactions  . Other     seasonal    Medications: Prior to Admission medications   Medication Sig Start Date End Date Taking? Authorizing Provider  Biotin 1000 MCG tablet Take 1,000 mcg by mouth 3 (three) times daily.   Yes [provider]  cetirizine (ZYRTEC) 10 MG tablet Take 10 mg by mouth daily.   Yes [provider]  Multiple Vitamin (MULTIVITAMIN) capsule Take 1 capsule by mouth daily.   Yes [provider]  predniSONE (STERAPRED UNI-PAK 21 TAB) 10 MG (21) TBPK tablet 3 tablets for 2 days, 2 tablets for 3 days, then one  tablet daily 03/15/18  Yes Shambley, Melody N, CNM  Red Yeast Rice 600 MG CAPS Take by mouth.   Yes [provider]  vitamin E (VITAMIN E) 400 UNIT capsule Take 400 Units by mouth daily.   Yes [provider]    Physical Exam Vitals: Blood pressure 118/74, height 5\' 6"  (1.676 m), weight 141 lb (64 kg), last menstrual period 11/29/2019, currently breastfeeding.  General: NAD HEENT: normocephalic, anicteric Thyroid: no enlargement, no palpable nodules Pulmonary: No increased work of breathing, CTAB Cardiovascular: RRR, distal pulses 2+ Breast: Breast symmetrical, no tenderness, no palpable nodules or masses, no skin or nipple retraction present, no nipple discharge.  No axillary or supraclavicular lymphadenopathy. Abdomen: NABS, soft, non-tender, non-distended.  Umbilicus without lesions.  No hepatomegaly, splenomegaly or masses palpable. No evidence of hernia  Genitourinary: deferred for no concerns/PAP interval Extremities: no edema, erythema, or tenderness Neurologic: Grossly intact Psychiatric: mood appropriate, affect full    Assessment: 39 y.o. G3P3003 routine annual exam  Plan: Problem List Items Addressed This Visit    None    Visit Diagnoses    Well woman exam without gynecological exam    -  Primary      1) STI screening  was offered and declined  2)  ASCCP guidelines and rationale discussed.  Patient opts for every 3 years screening interval. Due in 2 years.  3) Contraception - the patient is currently using  vasectomy.  She is happy with her current form of contraception and plans to continue  4) Routine healthcare maintenance including cholesterol, diabetes screening discussed Declines  5) Return in about 1 year (around 12/26/2020) for annual established gyn.   Rod Can, [facility] 12/27/2019, 2:28 PM

## 2020-11-21 ENCOUNTER — Ambulatory Visit: Payer: Self-pay | Admitting: Advanced Practice Midwife

## 2020-12-01 ENCOUNTER — Encounter: Payer: Self-pay | Admitting: Advanced Practice Midwife

## 2020-12-01 ENCOUNTER — Ambulatory Visit (INDEPENDENT_AMBULATORY_CARE_PROVIDER_SITE_OTHER): Payer: Self-pay | Admitting: Advanced Practice Midwife

## 2020-12-01 ENCOUNTER — Other Ambulatory Visit (HOSPITAL_COMMUNITY)
Admission: RE | Admit: 2020-12-01 | Discharge: 2020-12-01 | Disposition: A | Discharge status: Home / Self Care | Payer: Self-pay | Source: Ambulatory Visit | Attending: Advanced Practice Midwife | Admitting: Advanced Practice Midwife

## 2020-12-01 ENCOUNTER — Other Ambulatory Visit: Payer: Self-pay

## 2020-12-01 VITALS — BP 120/80 | Ht 66.0 in | Wt 153.0 lb

## 2020-12-01 DIAGNOSIS — Z124 Encounter for screening for malignant neoplasm of cervix: Secondary | ICD-10-CM | POA: Insufficient documentation

## 2020-12-01 DIAGNOSIS — Z01419 Encounter for gynecological examination (general) (routine) without abnormal findings: Secondary | ICD-10-CM

## 2020-12-01 DIAGNOSIS — R109 Unspecified abdominal pain: Secondary | ICD-10-CM

## 2020-12-01 DIAGNOSIS — R140 Abdominal distension (gaseous): Secondary | ICD-10-CM

## 2020-12-03 ENCOUNTER — Encounter: Payer: Self-pay | Admitting: Advanced Practice Midwife

## 2020-12-03 NOTE — Progress Notes (Signed)
Gynecology Annual Exam   Date of Service: 12/01/2020  Chief Complaint:  Chief Complaint  Patient presents with   Annual Exam    History of Present Illness: Patient is a 40 y.o. G3P3003 presents for annual exam. The patient has no gyn complaints today. She mentions abdominal pain and "bloating" for the past couple months. She is uncertain if it is related to anything in particular. The pain is crampy. She feels it daily especially when pressing on the painful area. She thinks it is getting worse. She denies constipation, diarrhea, nausea, vomiting. Discussed having abdominal ultrasound and referral to GI.  LMP: Patient's last menstrual period was 11/24/2020. Average Interval: regular, 28 days Duration of flow:  4-5  days Heavy Menses: starts heavy and progresses to light Clots: no Intermenstrual Bleeding: no Postcoital Bleeding: no Dysmenorrhea: no  The patient is sexually active. She currently uses vasectomy for contraception. She denies dyspareunia.  The patient does perform self breast exams.  There is no notable family history of breast or ovarian cancer in her family.  The patient wears seatbelts: yes.   The patient has regular exercise:  she walks regularly and is active with her children and dogs, she admits healthy diet with the exception of fast foods when on the go with children, adequate hydration and sleep .    The patient denies current symptoms of depression.    Review of Systems: Review of Systems  Constitutional:  Negative for chills and fever.  HENT:  Negative for congestion, ear discharge, ear pain, hearing loss, sinus pain and sore throat.   Eyes:  Negative for blurred vision and double vision.  Respiratory:  Negative for cough, shortness of breath and wheezing.   Cardiovascular:  Negative for chest pain, palpitations and leg swelling.  Gastrointestinal:  Positive for abdominal pain. Negative for blood in stool, constipation, diarrhea, heartburn, melena,  nausea and vomiting.  Genitourinary:  Negative for dysuria, flank pain, frequency, hematuria and urgency.  Musculoskeletal:  Negative for back pain, joint pain and myalgias.  Skin:  Negative for itching and rash.  Neurological:  Negative for dizziness, tingling, tremors, sensory change, speech change, focal weakness, seizures, loss of consciousness, weakness and headaches.  Endo/Heme/Allergies:  Negative for environmental allergies. Does not bruise/bleed easily.  Psychiatric/Behavioral:  Negative for depression, hallucinations, memory loss, substance abuse and suicidal ideas. The patient is not nervous/anxious and does not have insomnia.    Past Medical History:  Patient Active Problem List   Diagnosis Date Noted   Status post LEEP (loop electrosurgical excision procedure) of cervix 09/07/2017   Dysplasia of cervix, high grade CIN 2 06/16/2017    Diagnosis 1. Endocervix, LEEP - BENIGN TRANSITIONAL ZONE MUCOSA. - THERE IS NO EVIDENCE OF MALIGNANCY. 2. Cervix, LEEP - HIGH GRADE SQUAMOUS INTRAEPITHELIAL LESION, CIN-III (SEVERE DYSPLASIA/CIS). - THE SURGICAL RESECTION MARGINS ARE ARE NEGATIVE FOR HIGH GRADE DYSPLASIA. - SEE COMMENT. 3. Endocervix, curettage - BENIGN PROLIFERATIVE-TYPE ENDOMETRIUM. - BENIGN ENDOCERVICAL-TYPE MUCOSA. - THERE IS NO EVIDENCE OF MALIGNANCY.     Past Surgical History:  Past Surgical History:  Procedure Laterality Date   WISDOM TOOTH EXTRACTION     one taken out - early 49s    Gynecologic History:  Patient's last menstrual period was 11/24/2020. Contraception: vasectomy Last Pap: Results were: no abnormalities   Obstetric History: [id number]  Family History:  Family History  Problem Relation Age of Onset   Breast cancer Maternal Aunt    Cancer Paternal Uncle 9  prostate   Cancer Maternal Grandmother 64       lung   Cancer Maternal Grandfather 4       lung    Social History:  Social History   Socioeconomic History   Marital status:  Married    Spouse name: Not on file   Number of children: Not on file   Years of education: Not on file   Highest education level: Not on file  Occupational History   Not on file  Tobacco Use   Smoking status: Never   Smokeless tobacco: Never  Vaping Use   Vaping Use: Never used  Substance and Sexual Activity   Alcohol use: Yes    Comment: occas   Drug use: No   Sexual activity: Yes    Birth control/protection: Other-see comments    Comment: vasectomy  Other Topics Concern   Not on file  Social History Narrative   Not on file   Social Determinants of Health   Financial Resource Strain: Not on file  Food Insecurity: Not on file  Transportation Needs: Not on file  Physical Activity: Not on file  Stress: Not on file  Social Connections: Not on file  Intimate Partner Violence: Not on file    Allergies:  Allergies  Allergen Reactions   Other     seasonal    Medications: Prior to Admission medications   Medication Sig Start Date End Date Taking? Authorizing Provider  cetirizine (ZYRTEC) 10 MG tablet Take 10 mg by mouth daily.   Yes [provider]  Multiple Vitamin (MULTIVITAMIN) capsule Take 1 capsule by mouth daily.   Yes [provider]  vitamin E 180 MG (400 UNITS) capsule Take 400 Units by mouth daily.   Yes [provider]  Biotin 1000 MCG tablet Take 1,000 mcg by mouth 3 (three) times daily.    [provider]  predniSONE (STERAPRED UNI-PAK 21 TAB) 10 MG (21) TBPK tablet 3 tablets for 2 days, 2 tablets for 3 days, then one tablet daily 03/15/18   Shambley, Melody N, CNM  Red Yeast Rice 600 MG CAPS Take by mouth.    [provider]    Physical Exam Vitals: Blood pressure 120/80, height 5\' 6"  (1.676 m), weight 153 lb (69.4 kg), last menstrual period 11/24/2020  General: NAD HEENT: normocephalic, anicteric Thyroid: no enlargement, no palpable nodules Pulmonary: No increased work of breathing, CTAB Cardiovascular:  RRR, distal pulses 2+ Breast: Breast symmetrical, no tenderness, no palpable nodules or masses, no skin or nipple retraction present, no nipple discharge.  No axillary or supraclavicular lymphadenopathy. Abdomen: Lower abdomen palpates firm/round between pelvis and umbilicus, painful to palpation at 4 o'clock on firm area  Umbilicus without lesions.  No hepatomegaly, splenomegaly palpable. No evidence of hernia. Genitourinary:  External: Normal external female genitalia.  Normal urethral meatus, normal Bartholin's and Skene's glands.    Vagina: Normal vaginal mucosa, no evidence of prolapse.    Cervix: no CMT  Uterus: Palpated in the pelvis, normal contour, non-tender.   Adnexa: ovaries non-enlarged, no adnexal masses  Rectal: deferred  Lymphatic: no evidence of inguinal lymphadenopathy Extremities: no edema, erythema, or tenderness Neurologic: Grossly intact Psychiatric: mood appropriate, affect full   Assessment: 40 y.o. G3P3003 routine annual exam, abdominal pain/mass  Plan: Problem List Items Addressed This Visit   None Visit Diagnoses     Well woman exam with routine gynecological exam    -  Primary   Relevant Orders   Cytology - PAP  Abdominal bloating with cramps       Relevant Orders   Ambulatory referral to Gastroenterology   US Abdomen Complete   Screening for cervical cancer       Relevant Orders   Cytology - PAP       1) STI screening  was offered and declined  2)  ASCCP guidelines and rationale discussed.  Patient opts for every 3 years screening interval. PAP due next year  3) Contraception - the patient is currently using  vasectomy.  She is happy with her current form of contraception and plans to continue  4) Routine healthcare maintenance including cholesterol, diabetes screening discussed Declines  5) Abdominal pain: abdominal ultrasound ordered, referral to GI  6) Return in about 1 year (around 12/01/2021) for annual established gyn.   Rod Can, [facility] 12/03/2020, 12:22 PM

## 2020-12-04 ENCOUNTER — Ambulatory Visit
Admission: RE | Admit: 2020-12-04 | Discharge: 2020-12-04 | Disposition: A | Discharge status: Home / Self Care | Payer: Self-pay | Source: Ambulatory Visit | Attending: Advanced Practice Midwife | Admitting: Advanced Practice Midwife

## 2020-12-04 ENCOUNTER — Other Ambulatory Visit: Payer: Self-pay

## 2020-12-04 DIAGNOSIS — R140 Abdominal distension (gaseous): Secondary | ICD-10-CM | POA: Insufficient documentation

## 2020-12-04 DIAGNOSIS — R109 Unspecified abdominal pain: Secondary | ICD-10-CM | POA: Insufficient documentation

## 2020-12-05 ENCOUNTER — Ambulatory Visit (INDEPENDENT_AMBULATORY_CARE_PROVIDER_SITE_OTHER): Payer: Self-pay | Admitting: Obstetrics and Gynecology

## 2020-12-05 ENCOUNTER — Encounter: Payer: Self-pay | Admitting: Obstetrics and Gynecology

## 2020-12-05 VITALS — BP 110/70 | Ht 66.0 in | Wt 153.2 lb

## 2020-12-05 DIAGNOSIS — R1904 Left lower quadrant abdominal swelling, mass and lump: Secondary | ICD-10-CM

## 2020-12-05 LAB — CYTOLOGY - PAP
Comment: NEGATIVE
Diagnosis: NEGATIVE
High risk HPV: NEGATIVE

## 2020-12-05 NOTE — Progress Notes (Signed)
Patient ID: Ashley Barrett, female   DOB: 1981/01/01, 40 y.o.   MRN: [id number]  Reason for Consult: Gynecologic Exam   Referred by Joylene Igo, CNM  Subjective:     HPI:  Ashley Barrett is a 40 y.o. female she presents today for an abdominal ultrasound follow-up.  She recently had an annual exam with our midwife Rod Can who noted an abdominal mass.  Abdominal ultrasound showed a 17 x 9 x 16 cm cystic structure of uncertain etiology.  Patient reports that she has not had any symptoms of this mass.  She denies any abdominal pain or changes to bowel movements.  She denies any early satiety.  She denies any nausea vomiting.  She denies any abnormal uterine bleeding.  Gynecological History  Patient's last menstrual period was 11/24/2020.  Past Medical History:  Diagnosis Date   Abnormal Pap smear of cervix    Family History  Problem Relation Age of Onset   Breast cancer Maternal Aunt    Cancer Paternal Uncle 20       prostate   Cancer Maternal Grandmother 66       lung   Cancer Maternal Grandfather 75       lung   Past Surgical History:  Procedure Laterality Date   WISDOM TOOTH EXTRACTION     one taken out - early 37s    Short Social History:  Social History   Tobacco Use   Smoking status: Never   Smokeless tobacco: Never  Substance Use Topics   Alcohol use: Yes    Comment: occas    Allergies  Allergen Reactions   Other     seasonal    Current Outpatient Medications  Medication Sig Dispense Refill   cetirizine (ZYRTEC) 10 MG tablet Take 10 mg by mouth daily.     Multiple Vitamin (MULTIVITAMIN) capsule Take 1 capsule by mouth daily.     vitamin E 180 MG (400 UNITS) capsule Take 400 Units by mouth daily.     No current facility-administered medications for this visit.    Review of Systems  Constitutional: Negative for chills, fatigue, fever and unexpected weight change.  HENT: Negative for trouble swallowing.  Eyes: Negative for loss of  vision.  Respiratory: Negative for cough, shortness of breath and wheezing.  Cardiovascular: Negative for chest pain, leg swelling, palpitations and syncope.  GI: Negative for abdominal pain, blood in stool, diarrhea, nausea and vomiting.  GU: Negative for difficulty urinating, dysuria, frequency and hematuria.  Musculoskeletal: Negative for back pain, leg pain and joint pain.  Skin: Negative for rash.  Neurological: Negative for dizziness, headaches, light-headedness, numbness and seizures.  Psychiatric: Negative for behavioral problem, confusion, depressed mood and sleep disturbance.       Objective:  Objective   Vitals:   12/05/20 1330  BP: 110/70  Weight: 153 lb 3.2 oz (69.5 kg)  Height: 5\' 6"  (1.676 m)   Body mass index is 24.73 kg/m.  Physical Exam Vitals and nursing note reviewed. Exam conducted with a chaperone present.  Constitutional:      Appearance: Normal appearance. She is well-developed.  HENT:     Head: Normocephalic and atraumatic.  Eyes:     Extraocular Movements: Extraocular movements intact.     Pupils: Pupils are equal, round, and reactive to light.  Cardiovascular:     Rate and Rhythm: Normal rate and regular rhythm.  Pulmonary:     Effort: Pulmonary effort is normal. No respiratory distress.  Breath sounds: Normal breath sounds.  Abdominal:     General: Abdomen is flat.     Palpations: Abdomen is soft.  Genitourinary:    Comments: External: Normal appearing vulva. No lesions noted.   Bimanual examination: Uterus midline, non-tender, normal in size, shape and contour.  No CMT. large pelvic mass extending to the umbilicus.  no adnexal tenderness. Pelvis is somewhat fixed  Breast exam: exam not performed Musculoskeletal:        General: No signs of injury.  Skin:    General: Skin is warm and dry.  Neurological:     Mental Status: She is alert and oriented to person, place, and time.  Psychiatric:        Behavior: Behavior normal.         Thought Content: Thought content normal.        Judgment: Judgment normal.    Assessment/Plan:    40 year old with enlarged abdominal mass.  Possibly ovarian in origin.  Will order stat CT to follow-up the mass and plan for short interval follow-up with the patient next week for planning of neck steps.  If mass is ovarian in origin we will plan Roma test collection at the patient's next visit.   More than 20 minutes were spent face to face with the patient in the room, reviewing the medical record, labs and images, and coordinating care for the patient. The plan of management was discussed in detail and counseling was provided.    Adrian Prows MD [facility], [facility] 12/05/2020 1:59 PM

## 2020-12-08 ENCOUNTER — Ambulatory Visit
Admission: RE | Admit: 2020-12-08 | Discharge: 2020-12-08 | Disposition: A | Discharge status: Home / Self Care | Payer: Self-pay | Source: Ambulatory Visit | Attending: Obstetrics and Gynecology | Admitting: Obstetrics and Gynecology

## 2020-12-08 ENCOUNTER — Telehealth: Payer: Self-pay

## 2020-12-08 ENCOUNTER — Other Ambulatory Visit: Payer: Self-pay | Admitting: Obstetrics and Gynecology

## 2020-12-08 ENCOUNTER — Other Ambulatory Visit: Payer: Self-pay

## 2020-12-08 DIAGNOSIS — R140 Abdominal distension (gaseous): Secondary | ICD-10-CM

## 2020-12-08 MED ORDER — IOHEXOL 300 MG/ML  SOLN
100.0000 mL | Freq: Once | INTRAMUSCULAR | Status: AC | PRN
  Administered 2020-12-08: 100 mL via INTRAVENOUS

## 2020-12-08 NOTE — Telephone Encounter (Signed)
[facility] calling to give report.  [phone]  Adv we have the report in the pt's chart.

## 2020-12-09 ENCOUNTER — Other Ambulatory Visit: Payer: Self-pay | Admitting: Obstetrics and Gynecology

## 2020-12-09 DIAGNOSIS — N83202 Unspecified ovarian cyst, left side: Secondary | ICD-10-CM

## 2020-12-09 NOTE — Progress Notes (Signed)
Patient returned call to discuss her CT result.  Reviewed the results imaging with the patient.  Neck steps discussed including tumor marker testing and follow-up for surgical planning.  All of patient's questions answered.  Orders for ROMA were placed.  Adrian Prows MD, [facility], [facility] 12/09/2020 8:55 AM

## 2020-12-11 ENCOUNTER — Ambulatory Visit: Payer: Self-pay | Admitting: Advanced Practice Midwife

## 2020-12-15 ENCOUNTER — Other Ambulatory Visit: Payer: Self-pay

## 2020-12-15 DIAGNOSIS — N83202 Unspecified ovarian cyst, left side: Secondary | ICD-10-CM

## 2020-12-16 LAB — OVARIAN MALIGNANCY RISK-ROMA
Cancer Antigen (CA) 125: 27.5 U/mL (ref 0.0–38.1)
HE4: 42.2 pmol/L (ref 0.0–61.2)
Postmenopausal ROMA: 1.45
Premenopausal ROMA: 0.53

## 2020-12-16 LAB — POSTMENOPAUSAL INTERP: LOW

## 2020-12-16 LAB — PREMENOPAUSAL INTERP: LOW

## 2020-12-22 ENCOUNTER — Ambulatory Visit (INDEPENDENT_AMBULATORY_CARE_PROVIDER_SITE_OTHER): Payer: Self-pay | Admitting: Obstetrics and Gynecology

## 2020-12-22 ENCOUNTER — Other Ambulatory Visit: Payer: Self-pay

## 2020-12-22 VITALS — BP 114/70 | Ht 66.0 in | Wt 156.2 lb

## 2020-12-22 DIAGNOSIS — N83202 Unspecified ovarian cyst, left side: Secondary | ICD-10-CM

## 2020-12-22 NOTE — Progress Notes (Signed)
Patient ID: Ashley Barrett, female   DOB: 02/18/1980, 40 y.o.   MRN: [id number]  Reason for Consult: Follow-up   Referred by [facility],*  Subjective:     HPI:  Ashley Barrett is a 40 y.o. female. She is following up today regarding her ovarian cyst. ROMA result was favorable.  She has no new symptoms. IF possible would like to delay surgery until early 2023.   Gynecological History  Patient's last menstrual period was 11/24/2020.  Past Medical History:  Diagnosis Date   Abnormal Pap smear of cervix    Family History  Problem Relation Age of Onset   Breast cancer Maternal Aunt    Cancer Paternal Uncle 51       prostate   Cancer Maternal Grandmother 31       lung   Cancer Maternal Grandfather 54       lung   Past Surgical History:  Procedure Laterality Date   WISDOM TOOTH EXTRACTION     one taken out - early 85s    Short Social History:  Social History   Tobacco Use   Smoking status: Never   Smokeless tobacco: Never  Substance Use Topics   Alcohol use: Yes    Comment: occas    Allergies  Allergen Reactions   Other     seasonal    Current Outpatient Medications  Medication Sig Dispense Refill   cetirizine (ZYRTEC) 10 MG tablet Take 10 mg by mouth daily.     Multiple Vitamin (MULTIVITAMIN) capsule Take 1 capsule by mouth daily.     vitamin E 180 MG (400 UNITS) capsule Take 400 Units by mouth daily.     No current facility-administered medications for this visit.    Review of Systems  Constitutional: Negative for chills, fatigue, fever and unexpected weight change.  HENT: Negative for trouble swallowing.  Eyes: Negative for loss of vision.  Respiratory: Negative for cough, shortness of breath and wheezing.  Cardiovascular: Negative for chest pain, leg swelling, palpitations and syncope.  GI: Negative for abdominal pain, blood in stool, diarrhea, nausea and vomiting.  GU: Negative for difficulty urinating, dysuria, frequency and  hematuria.  Musculoskeletal: Negative for back pain, leg pain and joint pain.  Skin: Negative for rash.  Neurological: Negative for dizziness, headaches, light-headedness, numbness and seizures.  Psychiatric: Negative for behavioral problem, confusion, depressed mood and sleep disturbance.       Objective:  Objective   Vitals:   12/22/20 1130  BP: 114/70  Weight: 156 lb 3.2 oz (70.9 kg)  Height: 5\' 6"  (1.676 m)   Body mass index is 25.21 kg/m.  Physical Exam Vitals and nursing note reviewed. Exam conducted with a chaperone present.  Constitutional:      Appearance: Normal appearance.  HENT:     Head: Normocephalic and atraumatic.  Eyes:     Extraocular Movements: Extraocular movements intact.     Pupils: Pupils are equal, round, and reactive to light.  Cardiovascular:     Rate and Rhythm: Normal rate and regular rhythm.  Pulmonary:     Effort: Pulmonary effort is normal.     Breath sounds: Normal breath sounds.  Abdominal:     General: Abdomen is flat.     Palpations: Abdomen is soft.  Musculoskeletal:     Cervical back: Normal range of motion.  Skin:    General: Skin is warm and dry.  Neurological:     General: No focal deficit present.  Mental Status: She is alert and oriented to person, place, and time.  Psychiatric:        Behavior: Behavior normal.        Thought Content: Thought content normal.        Judgment: Judgment normal.    Assessment/Plan:    40 yo with enlarged 16 cm left ovarian cyst   IOTA adnex model 99.4% chance of benign tumor. Discussed options for laparoscopic ovarian cystectomy. Patient would like to plan for January 2023 if possible.  Discussed risks of rupture, torsion, and increased growth. Recommended pelvic rest. Encouraged her to come to the hospital in the even of any sudden onset acute pain.   Note sent to surgical scheduler to plan the procedure.   More than 20 minutes were spent face to face with the patient in the room,  reviewing the medical record, labs and images, and coordinating care for the patient. The plan of management was discussed in detail and counseling was provided.    Adrian Prows MD [facility], [facility] 12/22/2020 11:42 AM

## 2020-12-23 ENCOUNTER — Telehealth: Payer: Self-pay

## 2020-12-23 NOTE — Telephone Encounter (Signed)
-----   Message from Homero Fellers, MD sent at 12/22/2020 11:58 PM EST ----- Regarding: surgery request Surgery Booking Request Patient Full Name:  Ashley Barrett  MRN: [id number]  DOB: 21-Oct-1980  Surgeon: Homero Fellers, MD  Requested Surgery Date and Time: Jan 2023, if possible on a Wednesday for [facility] assistance if needed. Primary Diagnosis AND Code: Left ovarian cyst Secondary Diagnosis and Code:  Surgical Procedure: Robot assisted left ovarian cystectomy, possible oophorectomy RNFA Requested?: Yes L&D Notification: No Admission Status: same day surgery Length of Surgery: 100 min Special Case Needs: No H&P: Yes Phone Interview???:  Yes Interpreter: No Medical Clearance:  No Special Scheduling Instructions: No Any known health/anesthesia issues, diabetes, sleep apnea, latex allergy, defibrillator/pacemaker?: No Acuity: P2   (P1 highest, P2 delay may cause harm, P3 low, elective gyn, P4 lowest) Post op follow up visits: 1 week post op

## 2020-12-23 NOTE — Telephone Encounter (Signed)
Called patient to schedule Robot assisted left ovarian cystectomy, possible oophorectomy with [facility] [place] available to assist if needed.  DOS 02/04/21  H&P 01/28/21 @ 11:15   Pre-admit phone call appointment to be requested - date and time will be included on H&P paper work. Also all appointments will be updated on pt MyChart. Explained that this appointment has a call window. Based on the time scheduled will indicate if the call will be received within a 4 hour window before 1:00 or after.  Advised that pt may also receive calls from the hospital pharmacy and pre-service center.  Confirmed pt is self pay

## 2020-12-30 ENCOUNTER — Ambulatory Visit: Payer: Self-pay | Admitting: Advanced Practice Midwife

## 2021-01-22 ENCOUNTER — Ambulatory Visit: Payer: Self-pay | Admitting: Gastroenterology

## 2021-01-23 ENCOUNTER — Other Ambulatory Visit
Admission: RE | Admit: 2021-01-23 | Discharge: 2021-01-23 | Disposition: A | Discharge status: Home / Self Care | Payer: Self-pay | Source: Ambulatory Visit | Attending: Obstetrics and Gynecology | Admitting: Obstetrics and Gynecology

## 2021-01-23 ENCOUNTER — Other Ambulatory Visit: Payer: Self-pay

## 2021-01-23 HISTORY — DX: Nausea with vomiting, unspecified: R11.2

## 2021-01-23 HISTORY — DX: Other specified postprocedural states: Z98.890

## 2021-01-23 NOTE — Patient Instructions (Signed)
Your procedure is scheduled on: Wednesday February 04, 2021. Report to Day Surgery inside [facility] 2nd floor. To find out your arrival time please call [phone] between 1PM - 3PM on Tuesday February 03, 2021.  Remember: Instructions that are not followed completely may result in serious medical risk,  up to and including death, or upon the discretion of your surgeon and anesthesiologist your  surgery may need to be rescheduled.     _X__ 1. Do not eat food after midnight the night before your procedure.                 No chewing gum or hard candies. You may drink clear liquids up to 2 hours                 before you are scheduled to arrive for your surgery- DO not drink clear                 liquids within 2 hours of the start of your surgery.                 Clear Liquids include:  water, apple juice without pulp, clear Gatorade, G2 or                  Gatorade Zero (avoid Red/Purple/Blue), Black Coffee or Tea (Do not add                 anything to coffee or tea).  __X__2.   Complete the "Ensure Clear Pre-surgery Clear Carbohydrate Drink" provided to you, 2 hours before arrival. **If you are diabetic you will be provided with an alternative drink, Gatorade Zero or G2.  __X__3.  On the morning of surgery brush your teeth with toothpaste and water, you                may rinse your mouth with mouthwash if you wish.  Do not swallow any toothpaste or mouthwash.     _X__ 4.  No Alcohol for 24 hours before or after surgery.   _X__ 5.  Do Not Smoke or use e-cigarettes For 24 Hours Prior to Your Surgery.                 Do not use any chewable tobacco products for at least 6 hours prior to                 Surgery.  _X__  6.  Do not use any recreational drugs (marijuana, cocaine, heroin, ecstasy, MDMA or other)                For at least one week prior to your surgery.  Combination of these drugs with anesthesia                May have life threatening  results.  __X__7.  Notify your doctor if there is any change in your medical condition      (cold, fever, infections).     Do not wear jewelry, make-up, hairpins, clips or nail polish. Do not wear lotions, powders, or perfumes. You may wear deodorant. Do not shave 48 hours prior to surgery.  Do not bring valuables to the hospital.    [facility] is not responsible for any belongings or valuables.  Contacts, dentures or bridgework may not be worn into surgery. Leave your suitcase in the car. After surgery it may be brought to your room. For patients admitted to the hospital, discharge time  is determined by your treatment team.   Patients discharged the day of surgery will not be allowed to drive home.   Make arrangements for someone to be with you for the first 24 hours of your Same Day Discharge.   __X__ Take these medicines the morning of surgery with A SIP OF WATER:    1. None   2.   3.   4.  5.  6.  ____ Fleet Enema (as directed)   __X__ Use CHG Soap (or wipes) as directed  ____ Use Benzoyl Peroxide Gel as instructed  ____ Use inhalers on the day of surgery  ____ Stop metformin 2 days prior to surgery    ____ Take 1/2 of usual insulin dose the night before surgery. No insulin the morning          of surgery.   ____ Call your PCP, cardiologist, or Pulmonologist if taking Coumadin/Plavix/aspirin and ask when to stop before your surgery.   __X__ One Week prior to surgery- Stop Anti-inflammatories such as Ibuprofen, Aleve, Advil, Motrin, meloxicam (MOBIC), diclofenac, etodolac, ketorolac, Toradol, Daypro, piroxicam, Goody's or BC powders. OK TO USE TYLENOL IF NEEDED   _X___ Stop supplements until after surgery.    ____ Bring C-Pap to the hospital.    If you have any questions regarding your pre-procedure instructions,  Please call Pre-admit Testing at [phone]

## 2021-01-28 ENCOUNTER — Other Ambulatory Visit: Payer: Self-pay

## 2021-01-28 ENCOUNTER — Encounter
Admission: RE | Admit: 2021-01-28 | Discharge: 2021-01-28 | Disposition: A | Discharge status: Home / Self Care | Payer: Self-pay | Source: Ambulatory Visit | Attending: Obstetrics and Gynecology | Admitting: Obstetrics and Gynecology

## 2021-01-28 ENCOUNTER — Encounter: Payer: Self-pay | Admitting: Obstetrics and Gynecology

## 2021-01-28 ENCOUNTER — Encounter: Payer: Self-pay | Admitting: Urgent Care

## 2021-01-28 ENCOUNTER — Ambulatory Visit (INDEPENDENT_AMBULATORY_CARE_PROVIDER_SITE_OTHER): Payer: Self-pay | Admitting: Obstetrics and Gynecology

## 2021-01-28 VITALS — BP 112/72 | Ht 66.0 in | Wt 155.8 lb

## 2021-01-28 DIAGNOSIS — Z01812 Encounter for preprocedural laboratory examination: Secondary | ICD-10-CM | POA: Insufficient documentation

## 2021-01-28 DIAGNOSIS — N83202 Unspecified ovarian cyst, left side: Secondary | ICD-10-CM

## 2021-01-28 LAB — TYPE AND SCREEN
ABO/RH(D): O POS
Antibody Screen: NEGATIVE

## 2021-01-28 LAB — CBC
HCT: 41.1 % (ref 36.0–46.0)
Hemoglobin: 13.9 g/dL (ref 12.0–15.0)
MCH: 30.1 pg (ref 26.0–34.0)
MCHC: 33.8 g/dL (ref 30.0–36.0)
MCV: 89 fL (ref 80.0–100.0)
Platelets: 174 10*3/uL (ref 150–400)
RBC: 4.62 MIL/uL (ref 3.87–5.11)
RDW: 11.9 % (ref 11.5–15.5)
WBC: 4.3 10*3/uL (ref 4.0–10.5)
nRBC: 0 % (ref 0.0–0.2)

## 2021-01-28 NOTE — Patient Instructions (Signed)
Ovarian Cystectomy, Care After This sheet gives you information about how to care for yourself after your procedure. Your health care provider may also give you more specific instructions. If you have problems or questions, contact your health care provider. What can I expect after the procedure? After the procedure, it is common to have: Pain in the abdomen, especially at the incision areas. You will be given pain medicine to control the pain. Tiredness. This is a normal part of the recovery process. Your energy level will return to normal over the next several weeks. Follow these instructions at home: Medicines Take over-the-counter and prescription medicines only as told by your health care provider. If you were prescribed an antibiotic medicine, use it as told by your health care provider. Do not stop using the antibiotic even if you start to feel better. Do not take aspirin because it can cause bleeding. Ask your health care provider if the medicine prescribed to you: Requires you to avoid driving or using heavy machinery. Can cause constipation. You may need to take these actions to prevent or treat constipation: Drink enough fluid to keep your urine pale yellow. Take over-the-counter or prescription medicines. Eat foods that are high in fiber, such as beans, whole grains, and fresh fruits and vegetables. Limit foods that are high in fat and processed sugars, such as fried or sweet foods. Incision care  Follow instructions from your health care provider about how to take care of your incisions. Make sure you: Wash your hands with soap and water for at least 20 seconds before and after you change your bandage (dressing). If soap and water are not available, use hand sanitizer. Change your dressing as told by your health care provider. Leave stitches (sutures), skin glue, or adhesive strips in place. These skin closures may need to stay in place for 2 weeks or longer. If adhesive strip  edges start to loosen and curl up, you may trim the loose edges. Do not remove adhesive strips completely unless your health care provider tells you to do that. Check your incision areas every day for signs of infection. Check for: More redness, swelling, or pain. Fluid or blood. Warmth. Pus or a bad smell. Do not take baths, swim, or use a hot tub until your health care provider approves. Ask your health care provider if you may take showers. You may only be allowed to take sponge baths. Activity Rest as told by your health care provider. Avoid sitting for a long time without moving. Get up to take short walks every 1-2 hours. This is important to improve blood flow and breathing. Ask for help if you feel weak or unsteady. Do not lift anything that is heavier than 10 lb (4.5 kg), or the limit that you are told, until your health care provider says that it is safe. Return to your normal activities and diet as told by your health care provider. Ask your health care provider what activities are safe for you. General instructions Do not douche, use tampons, or have sex until your health care provider says it is okay. Do not use any products that contain nicotine or tobacco, such as cigarettes, e-cigarettes, and chewing tobacco. These can delay incision healing after surgery. If you need help quitting, ask your health care provider. Keep all follow-up visits. This is important. Contact a health care provider if: You have a fever or chills. You feel nauseous or you vomit. You have pain when you urinate or have blood in  your urine. You have a rash on your body. You have pain or redness where the IV was inserted. You have pain that is not relieved with medicine. You have any of these signs of infection: More redness, swelling, or pain around an incision. Fluid or blood coming from an incision. Warmth coming from an incision. Pus or a bad smell coming from an incision. Get help right away  if: You have chest pain or shortness of breath. You feel dizzy or light-headed. You have heavy bleeding. You have increasing abdominal pain that is not relieved with medicine. You have pain, swelling, or redness in your leg. Your incision is opening and the edges are not staying together. Summary After the procedure, it is common to have some pain in your abdomen. You will be given pain medicine to control the pain. Follow instructions from your health care provider about how to take care of your incisions. Do not douche, use tampons, or have sex until your health care provider says it okay. Keep all follow-up visits. This is important. This information is not intended to replace advice given to you by your health care provider. Make sure you discuss any questions you have with your health care provider. Document Revised: 06/21/2019 Document Reviewed: 06/21/2019 Elsevier Patient Education  2022 [employer].

## 2021-01-28 NOTE — H&P (View-Only) (Signed)
Patient ID: Ashley Barrett, female   DOB: July 08, 1980, 41 y.o.   MRN: [id number]  Reason for Consult: Gynecologic Exam   Referred by [facility],*  Subjective:     HPI:  Ashley Barrett is a 41 y.o. female. She is presenting today for preoperative visit.  She has a history of a left-sided ovarian cyst measuring 16 x 10 x 16 cm.  She has no new complaints.  She has not been experiencing changes to bowel movements or urination.  Premenopausal Roma score 0.53 CA125 27.5  CT result 12/08/2020 Fluid attenuation cystic lesion arising from the left adnexa measuring up to 16.7 cm, without thickened enhancing internal septations or soft tissue nodularity visualized. 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  Pap smear: 12/01/2020 NIL HPV negative  Gynecological History  Patient's last menstrual period was 01/18/2021.  Past Medical History:  Diagnosis Date   Abnormal Pap smear of cervix    PONV (postoperative nausea and vomiting)    Family History  Problem Relation Age of Onset   Breast cancer Maternal Aunt    Cancer Paternal Uncle 35       prostate   Cancer Maternal Grandmother 84       lung   Cancer Maternal Grandfather 62       lung   Past Surgical History:  Procedure Laterality Date   WISDOM TOOTH EXTRACTION     one taken out - early 50s    Short Social History:  Social History   Tobacco Use   Smoking status: Never   Smokeless tobacco: Never  Substance Use Topics   Alcohol use: Yes    Comment: occas    Allergies  Allergen Reactions   Other     seasonal    Current Outpatient Medications  Medication Sig Dispense Refill   ibuprofen (ADVIL) 200 MG tablet Take 400 mg by mouth every 6 (six) hours as needed for moderate pain or headache.     loratadine (CLARITIN) 10 MG tablet Take 10 mg by mouth daily as needed for allergies.     Multiple Vitamin (MULTIVITAMIN) capsule Take 1  capsule by mouth 2 (two) times a week.     No current facility-administered medications for this visit.    Review of Systems  Constitutional: Negative for chills, fatigue, fever and unexpected weight change.  HENT: Negative for trouble swallowing.  Eyes: Negative for loss of vision.  Respiratory: Negative for cough, shortness of breath and wheezing.  Cardiovascular: Negative for chest pain, leg swelling, palpitations and syncope.  GI: Negative for abdominal pain, blood in stool, diarrhea, nausea and vomiting.  GU: Negative for difficulty urinating, dysuria, frequency and hematuria.  Musculoskeletal: Negative for back pain, leg pain and joint pain.  Skin: Negative for rash.  Neurological: Negative for dizziness, headaches, light-headedness, numbness and seizures.  Psychiatric: Negative for behavioral problem, confusion, depressed mood and sleep disturbance.       Objective:  Objective   Vitals:   01/28/21 1124  BP: 112/72  Weight: 155 lb 12.8 oz (70.7 kg)  Height: 5\' 6"  (1.676 m)   Body mass index is 25.15 kg/m.  Physical Exam Vitals and nursing note reviewed. Exam conducted with a chaperone present.  Constitutional:      Appearance: Normal appearance.  HENT:     Head: Normocephalic and atraumatic.  Eyes:     Extraocular Movements: Extraocular movements intact.     Pupils: Pupils are equal, round, and  reactive to light.  Cardiovascular:     Rate and Rhythm: Normal rate and regular rhythm.  Pulmonary:     Effort: Pulmonary effort is normal.     Breath sounds: Normal breath sounds.  Abdominal:     General: Abdomen is flat.     Palpations: Abdomen is soft. There is mass.     Comments: Ovarian mass extending approximately 3 cm above the umbilicus.  Musculoskeletal:     Cervical back: Normal range of motion.  Skin:    General: Skin is warm and dry.  Neurological:     General: No focal deficit present.     Mental Status: She is alert and oriented to person, place, and  time.  Psychiatric:        Behavior: Behavior normal.        Thought Content: Thought content normal.        Judgment: Judgment normal.    Assessment/Plan:     41 year old with left-sided ovarian cyst XI robot assisted laparoscopic ovarian cystectomy has been planned for 02/04/2021 Discussed risk benefits and alternatives to surgery.  Discussed risks and possible oophorectomy should bleeding or suspicion of malignancy be encountered.  Discussed the risk of surgical spillage with draining the cyst laparoscopically which will be essential for completing the case in a laparoscopic fashion, but would in the setting of malignancy result and upstaging of a cancer.    Discussed risk of bleeding risk infection risk of damage to surrounding pelvic organs.  Discussed anticipated recovery time after the surgery.  Patient does have family members at home who will be able to provide her with postoperative care.  All questions were answered and consents signed.  More than 20 minutes were spent face to face with the patient in the room, reviewing the medical record, labs and images, and coordinating care for the patient. The plan of management was discussed in detail and counseling was provided.   Adrian Prows MD [facility], [facility] 01/28/2021 12:02 PM

## 2021-01-28 NOTE — Progress Notes (Signed)
Patient ID: Ashley Barrett, female   DOB: Jun 22, 1980, 41 y.o.   MRN: [id number]  Reason for Consult: Gynecologic Exam   Referred by [facility],*  Subjective:     HPI:  Ashley Barrett is a 41 y.o. female. She is presenting today for preoperative visit.  She has a history of a left-sided ovarian cyst measuring 16 x 10 x 16 cm.  She has no new complaints.  She has not been experiencing changes to bowel movements or urination.  Premenopausal Roma score 0.53 CA125 27.5  CT result 12/08/2020 Fluid attenuation cystic lesion arising from the left adnexa measuring up to 16.7 cm, without thickened enhancing internal septations or soft tissue nodularity visualized. 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  Pap smear: 12/01/2020 NIL HPV negative  Gynecological History  Patient's last menstrual period was 01/18/2021.  Past Medical History:  Diagnosis Date   Abnormal Pap smear of cervix    PONV (postoperative nausea and vomiting)    Family History  Problem Relation Age of Onset   Breast cancer Maternal Aunt    Cancer Paternal Uncle 18       prostate   Cancer Maternal Grandmother 71       lung   Cancer Maternal Grandfather 36       lung   Past Surgical History:  Procedure Laterality Date   WISDOM TOOTH EXTRACTION     one taken out - early 3s    Short Social History:  Social History   Tobacco Use   Smoking status: Never   Smokeless tobacco: Never  Substance Use Topics   Alcohol use: Yes    Comment: occas    Allergies  Allergen Reactions   Other     seasonal    Current Outpatient Medications  Medication Sig Dispense Refill   ibuprofen (ADVIL) 200 MG tablet Take 400 mg by mouth every 6 (six) hours as needed for moderate pain or headache.     loratadine (CLARITIN) 10 MG tablet Take 10 mg by mouth daily as needed for allergies.     Multiple Vitamin (MULTIVITAMIN) capsule Take 1  capsule by mouth 2 (two) times a week.     No current facility-administered medications for this visit.    Review of Systems  Constitutional: Negative for chills, fatigue, fever and unexpected weight change.  HENT: Negative for trouble swallowing.  Eyes: Negative for loss of vision.  Respiratory: Negative for cough, shortness of breath and wheezing.  Cardiovascular: Negative for chest pain, leg swelling, palpitations and syncope.  GI: Negative for abdominal pain, blood in stool, diarrhea, nausea and vomiting.  GU: Negative for difficulty urinating, dysuria, frequency and hematuria.  Musculoskeletal: Negative for back pain, leg pain and joint pain.  Skin: Negative for rash.  Neurological: Negative for dizziness, headaches, light-headedness, numbness and seizures.  Psychiatric: Negative for behavioral problem, confusion, depressed mood and sleep disturbance.       Objective:  Objective   Vitals:   01/28/21 1124  BP: 112/72  Weight: 155 lb 12.8 oz (70.7 kg)  Height: 5\' 6"  (1.676 m)   Body mass index is 25.15 kg/m.  Physical Exam Vitals and nursing note reviewed. Exam conducted with a chaperone present.  Constitutional:      Appearance: Normal appearance.  HENT:     Head: Normocephalic and atraumatic.  Eyes:     Extraocular Movements: Extraocular movements intact.     Pupils: Pupils are equal, round, and  reactive to light.  Cardiovascular:     Rate and Rhythm: Normal rate and regular rhythm.  Pulmonary:     Effort: Pulmonary effort is normal.     Breath sounds: Normal breath sounds.  Abdominal:     General: Abdomen is flat.     Palpations: Abdomen is soft. There is mass.     Comments: Ovarian mass extending approximately 3 cm above the umbilicus.  Musculoskeletal:     Cervical back: Normal range of motion.  Skin:    General: Skin is warm and dry.  Neurological:     General: No focal deficit present.     Mental Status: She is alert and oriented to person, place, and  time.  Psychiatric:        Behavior: Behavior normal.        Thought Content: Thought content normal.        Judgment: Judgment normal.    Assessment/Plan:     40 year old with left-sided ovarian cyst XI robot assisted laparoscopic ovarian cystectomy has been planned for 02/04/2021 Discussed risk benefits and alternatives to surgery.  Discussed risks and possible oophorectomy should bleeding or suspicion of malignancy be encountered.  Discussed the risk of surgical spillage with draining the cyst laparoscopically which will be essential for completing the case in a laparoscopic fashion, but would in the setting of malignancy result and upstaging of a cancer.    Discussed risk of bleeding risk infection risk of damage to surrounding pelvic organs.  Discussed anticipated recovery time after the surgery.  Patient does have family members at home who will be able to provide her with postoperative care.  All questions were answered and consents signed.  More than 20 minutes were spent face to face with the patient in the room, reviewing the medical record, labs and images, and coordinating care for the patient. The plan of management was discussed in detail and counseling was provided.   Adrian Prows MD [facility], [facility] 01/28/2021 12:02 PM

## 2021-02-04 ENCOUNTER — Ambulatory Visit
Admission: RE | Admit: 2021-02-04 | Discharge: 2021-02-04 | Disposition: A | Discharge status: Home / Self Care | Payer: Self-pay | Attending: Obstetrics and Gynecology | Admitting: Obstetrics and Gynecology

## 2021-02-04 ENCOUNTER — Ambulatory Visit: Payer: Self-pay | Admitting: Anesthesiology

## 2021-02-04 ENCOUNTER — Encounter
Admission: RE | Disposition: A | Discharge status: Home / Self Care | Payer: Self-pay | Source: Home / Self Care | Attending: Obstetrics and Gynecology

## 2021-02-04 ENCOUNTER — Encounter: Payer: Self-pay | Admitting: Obstetrics and Gynecology

## 2021-02-04 ENCOUNTER — Other Ambulatory Visit: Payer: Self-pay

## 2021-02-04 DIAGNOSIS — D279 Benign neoplasm of unspecified ovary: Secondary | ICD-10-CM

## 2021-02-04 DIAGNOSIS — Z87898 Personal history of other specified conditions: Secondary | ICD-10-CM | POA: Insufficient documentation

## 2021-02-04 DIAGNOSIS — K219 Gastro-esophageal reflux disease without esophagitis: Secondary | ICD-10-CM | POA: Insufficient documentation

## 2021-02-04 DIAGNOSIS — N83202 Unspecified ovarian cyst, left side: Secondary | ICD-10-CM

## 2021-02-04 DIAGNOSIS — D271 Benign neoplasm of left ovary: Secondary | ICD-10-CM | POA: Insufficient documentation

## 2021-02-04 HISTORY — PX: ROBOTIC ASSISTED LAPAROSCOPIC OVARIAN CYSTECTOMY: SHX6081

## 2021-02-04 LAB — POCT PREGNANCY, URINE: Preg Test, Ur: NEGATIVE

## 2021-02-04 LAB — ABO/RH: ABO/RH(D): O POS

## 2021-02-04 SURGERY — EXCISION, CYST, OVARY, ROBOT-ASSISTED, LAPAROSCOPIC
Anesthesia: General | Laterality: Left

## 2021-02-04 MED ORDER — LACTATED RINGERS IV SOLN: INTRAVENOUS | Status: DC

## 2021-02-04 MED ORDER — DEXAMETHASONE SODIUM PHOSPHATE 10 MG/ML IJ SOLN
INTRAMUSCULAR | Status: AC
  Filled 2021-02-04: qty 1

## 2021-02-04 MED ORDER — ACETAMINOPHEN 10 MG/ML IV SOLN
INTRAVENOUS | Status: AC
  Filled 2021-02-04: qty 100

## 2021-02-04 MED ORDER — SODIUM CHLORIDE FLUSH 0.9 % IV SOLN
INTRAVENOUS | Status: AC
  Filled 2021-02-04: qty 10

## 2021-02-04 MED ORDER — MIDAZOLAM HCL 2 MG/2ML IJ SOLN
INTRAMUSCULAR | Status: AC
  Filled 2021-02-04: qty 2

## 2021-02-04 MED ORDER — FENTANYL CITRATE (PF) 100 MCG/2ML IJ SOLN: 25.0000 ug | INTRAMUSCULAR | Status: DC | PRN

## 2021-02-04 MED ORDER — CHLORHEXIDINE GLUCONATE 0.12 % MT SOLN
OROMUCOSAL | Status: AC
  Administered 2021-02-04: 15 mL via OROMUCOSAL
  Filled 2021-02-04: qty 15

## 2021-02-04 MED ORDER — SODIUM CHLORIDE 0.9 % IR SOLN
Status: DC | PRN
  Administered 2021-02-04: 600 mL
  Administered 2021-02-04: 200 mL

## 2021-02-04 MED ORDER — PHENYLEPHRINE HCL-NACL 20-0.9 MG/250ML-% IV SOLN
INTRAVENOUS | Status: DC | PRN
  Administered 2021-02-04 (×2): 50 ug/min via INTRAVENOUS

## 2021-02-04 MED ORDER — 0.9 % SODIUM CHLORIDE (POUR BTL) OPTIME
TOPICAL | Status: DC | PRN
  Administered 2021-02-04: 100 mL

## 2021-02-04 MED ORDER — PROMETHAZINE HCL 25 MG/ML IJ SOLN
INTRAMUSCULAR | Status: AC
  Filled 2021-02-04: qty 1

## 2021-02-04 MED ORDER — ONDANSETRON HCL 4 MG/2ML IJ SOLN
INTRAMUSCULAR | Status: DC | PRN
Start: 2021-02-04 — End: 2021-02-04
  Administered 2021-02-04: 4 mg via INTRAVENOUS

## 2021-02-04 MED ORDER — ROCURONIUM BROMIDE 100 MG/10ML IV SOLN
INTRAVENOUS | Status: DC | PRN
  Administered 2021-02-04: 40 mg via INTRAVENOUS
  Administered 2021-02-04: 20 mg via INTRAVENOUS
  Administered 2021-02-04 (×2): 30 mg via INTRAVENOUS

## 2021-02-04 MED ORDER — HEMOSTATIC AGENTS (NO CHARGE) OPTIME
TOPICAL | Status: DC | PRN
  Administered 2021-02-04: 1 via TOPICAL

## 2021-02-04 MED ORDER — ACETAMINOPHEN 500 MG PO TABS: 1000.0000 mg | ORAL_TABLET | Freq: Four times a day (QID) | ORAL | 0 refills | Status: AC | PRN

## 2021-02-04 MED ORDER — PROPOFOL 10 MG/ML IV BOLUS
INTRAVENOUS | Status: DC | PRN
Start: 2021-02-04 — End: 2021-02-04
  Administered 2021-02-04: 150 mg via INTRAVENOUS

## 2021-02-04 MED ORDER — FAMOTIDINE 20 MG PO TABS
ORAL_TABLET | ORAL | Status: AC
  Administered 2021-02-04: 20 mg via ORAL
  Filled 2021-02-04: qty 1

## 2021-02-04 MED ORDER — POVIDONE-IODINE 10 % EX SWAB: 2.0000 "application " | Freq: Once | CUTANEOUS | Status: DC

## 2021-02-04 MED ORDER — KETOROLAC TROMETHAMINE 30 MG/ML IJ SOLN
INTRAMUSCULAR | Status: AC
  Filled 2021-02-04: qty 1

## 2021-02-04 MED ORDER — ONDANSETRON HCL 4 MG/2ML IJ SOLN
INTRAMUSCULAR | Status: AC
  Filled 2021-02-04: qty 2

## 2021-02-04 MED ORDER — SUGAMMADEX SODIUM 200 MG/2ML IV SOLN
INTRAVENOUS | Status: DC | PRN
  Administered 2021-02-04: 150 mg via INTRAVENOUS

## 2021-02-04 MED ORDER — LIDOCAINE HCL (CARDIAC) PF 100 MG/5ML IV SOSY
PREFILLED_SYRINGE | INTRAVENOUS | Status: DC | PRN
  Administered 2021-02-04: 100 mg via INTRAVENOUS

## 2021-02-04 MED ORDER — KETOROLAC TROMETHAMINE 30 MG/ML IJ SOLN
INTRAMUSCULAR | Status: DC | PRN
  Administered 2021-02-04: 30 mg via INTRAVENOUS

## 2021-02-04 MED ORDER — PROMETHAZINE HCL 25 MG/ML IJ SOLN
6.2500 mg | INTRAMUSCULAR | Status: DC | PRN
  Administered 2021-02-04: 6.25 mg via INTRAVENOUS

## 2021-02-04 MED ORDER — ACETAMINOPHEN 10 MG/ML IV SOLN
INTRAVENOUS | Status: DC | PRN
  Administered 2021-02-04: 1000 mg via INTRAVENOUS

## 2021-02-04 MED ORDER — PROPOFOL 500 MG/50ML IV EMUL
INTRAVENOUS | Status: AC
  Filled 2021-02-04: qty 50

## 2021-02-04 MED ORDER — APREPITANT 40 MG PO CAPS
ORAL_CAPSULE | ORAL | Status: AC
  Administered 2021-02-04: 40 mg via ORAL
  Filled 2021-02-04: qty 1

## 2021-02-04 MED ORDER — OXYCODONE HCL 5 MG PO TABS: 5.0000 mg | ORAL_TABLET | Freq: Three times a day (TID) | ORAL | 0 refills | Status: AC | PRN

## 2021-02-04 MED ORDER — CHLORHEXIDINE GLUCONATE 0.12 % MT SOLN: 15.0000 mL | Freq: Once | OROMUCOSAL | Status: AC

## 2021-02-04 MED ORDER — MIDAZOLAM HCL 2 MG/2ML IJ SOLN
INTRAMUSCULAR | Status: DC | PRN
  Administered 2021-02-04: 2 mg via INTRAVENOUS

## 2021-02-04 MED ORDER — BUPIVACAINE LIPOSOME 1.3 % IJ SUSP
INTRAMUSCULAR | Status: DC | PRN
  Administered 2021-02-04: 15 mL
  Administered 2021-02-04: 5 mL

## 2021-02-04 MED ORDER — BUPIVACAINE LIPOSOME 1.3 % IJ SUSP
INTRAMUSCULAR | Status: AC
  Filled 2021-02-04: qty 20

## 2021-02-04 MED ORDER — PHENYLEPHRINE HCL (PRESSORS) 10 MG/ML IV SOLN
INTRAVENOUS | Status: AC
  Filled 2021-02-04: qty 1

## 2021-02-04 MED ORDER — ORAL CARE MOUTH RINSE: 15.0000 mL | Freq: Once | OROMUCOSAL | Status: AC

## 2021-02-04 MED ORDER — HYDROMORPHONE HCL 1 MG/ML IJ SOLN
INTRAMUSCULAR | Status: AC
  Filled 2021-02-04: qty 1

## 2021-02-04 MED ORDER — SEVOFLURANE IN SOLN
RESPIRATORY_TRACT | Status: AC
  Filled 2021-02-04: qty 250

## 2021-02-04 MED ORDER — APREPITANT 40 MG PO CAPS: 40.0000 mg | ORAL_CAPSULE | Freq: Once | ORAL | Status: AC

## 2021-02-04 MED ORDER — IBUPROFEN 600 MG PO TABS: 600.0000 mg | ORAL_TABLET | Freq: Four times a day (QID) | ORAL | 0 refills | Status: AC | PRN

## 2021-02-04 MED ORDER — FAMOTIDINE 20 MG PO TABS: 20.0000 mg | ORAL_TABLET | Freq: Once | ORAL | Status: AC

## 2021-02-04 MED ORDER — DEXAMETHASONE SODIUM PHOSPHATE 10 MG/ML IJ SOLN
INTRAMUSCULAR | Status: DC | PRN
  Administered 2021-02-04: 8 mg via INTRAVENOUS

## 2021-02-04 MED ORDER — ROCURONIUM BROMIDE 10 MG/ML (PF) SYRINGE
PREFILLED_SYRINGE | INTRAVENOUS | Status: AC
  Filled 2021-02-04: qty 10

## 2021-02-04 MED ORDER — METOCLOPRAMIDE HCL 10 MG PO TABS: 10.0000 mg | ORAL_TABLET | Freq: Three times a day (TID) | ORAL | 0 refills | Status: AC

## 2021-02-04 MED ORDER — PHENYLEPHRINE HCL-NACL 20-0.9 MG/250ML-% IV SOLN
INTRAVENOUS | Status: AC
  Filled 2021-02-04: qty 250

## 2021-02-04 SURGICAL SUPPLY — 73 items
ADH SKN CLS APL DERMABOND .7 (GAUZE/BANDAGES/DRESSINGS) ×1
ANCHOR TIS RET SYS 235ML (MISCELLANEOUS) ×1 IMPLANT
APL PRP STRL LF DISP 70% ISPRP (MISCELLANEOUS) ×1
BAG DRN RND TRDRP ANRFLXCHMBR (UROLOGICAL SUPPLIES) ×1
BAG INFUSER PRESSURE 100CC (MISCELLANEOUS) IMPLANT
BAG TISS RTRVL C235 10X14 (MISCELLANEOUS) ×1
BAG URINE DRAIN 2000ML AR STRL (UROLOGICAL SUPPLIES) ×2 IMPLANT
BLADE SURG SZ11 CARB STEEL (BLADE) ×2 IMPLANT
CANNULA DILATOR 5 W/SLV (CANNULA) ×1 IMPLANT
CANNULA REDUC XI 12-8 STAPL (CANNULA) ×1
CANNULA REDUCER 12-8 DVNC XI (CANNULA) ×1 IMPLANT
CATH FOLEY 2WAY  5CC 16FR (CATHETERS) ×1
CATH FOLEY 2WAY 5CC 16FR (CATHETERS) ×1
CATH URTH 16FR FL 2W BLN LF (CATHETERS) ×1 IMPLANT
CHLORAPREP W/TINT 26 (MISCELLANEOUS) ×2 IMPLANT
COVER TIP SHEARS 8 DVNC (MISCELLANEOUS) ×1 IMPLANT
COVER TIP SHEARS 8MM DA VINCI (MISCELLANEOUS) ×1
DEFOGGER SCOPE WARMER CLEARIFY (MISCELLANEOUS) ×2 IMPLANT
DERMABOND ADVANCED (GAUZE/BANDAGES/DRESSINGS) ×1
DERMABOND ADVANCED .7 DNX12 (GAUZE/BANDAGES/DRESSINGS) ×1 IMPLANT
DRAPE 3/4 80X56 (DRAPES) ×4 IMPLANT
DRAPE ARM DVNC X/XI (DISPOSABLE) ×4 IMPLANT
DRAPE COLUMN DVNC XI (DISPOSABLE) ×1 IMPLANT
DRAPE DA VINCI XI ARM (DISPOSABLE) ×4
DRAPE DA VINCI XI COLUMN (DISPOSABLE) ×1
DRAPE UNDER BUTTOCK W/FLU (DRAPES) ×2 IMPLANT
DRESSING SURGICEL FIBRLLR 1X2 (HEMOSTASIS) IMPLANT
DRSG SURGICEL FIBRILLAR 1X2 (HEMOSTASIS) ×2
DRSG TEGADERM 2-3/8X2-3/4 SM (GAUZE/BANDAGES/DRESSINGS) ×8 IMPLANT
DRSG TEGADERM 6X8 (GAUZE/BANDAGES/DRESSINGS) ×1 IMPLANT
ELECT REM PT RETURN 9FT ADLT (ELECTROSURGICAL) ×2
ELECTRODE REM PT RTRN 9FT ADLT (ELECTROSURGICAL) ×1 IMPLANT
GAUZE 4X4 16PLY ~~LOC~~+RFID DBL (SPONGE) ×4 IMPLANT
GOWN STRL REUS W/ TWL LRG LVL3 (GOWN DISPOSABLE) ×8 IMPLANT
GOWN STRL REUS W/TWL LRG LVL3 (GOWN DISPOSABLE) ×16
GRASPER SUT TROCAR 14GX15 (MISCELLANEOUS) ×1 IMPLANT
IRRIGATION STRYKERFLOW (MISCELLANEOUS) IMPLANT
IRRIGATOR STRYKERFLOW (MISCELLANEOUS) ×2
IV NS 1000ML (IV SOLUTION) ×2
IV NS 1000ML BAXH (IV SOLUTION) IMPLANT
KIT PINK PAD W/HEAD ARE REST (MISCELLANEOUS) ×2
KIT PINK PAD W/HEAD ARM REST (MISCELLANEOUS) ×1 IMPLANT
KRONNER MANIPUJECTOR ×1 IMPLANT
LABEL OR SOLS (LABEL) ×2 IMPLANT
MANIFOLD NEPTUNE II (INSTRUMENTS) ×2 IMPLANT
NDL INSUFF ACCESS 14 VERSASTEP (NEEDLE) ×1 IMPLANT
NEEDLE HYPO 22GX1.5 SAFETY (NEEDLE) ×2 IMPLANT
NS IRRIG 1000ML POUR BTL (IV SOLUTION) ×2 IMPLANT
OBTURATOR OPTICAL STANDARD 8MM (TROCAR) ×1
OBTURATOR OPTICAL STND 8 DVNC (TROCAR) ×1
OBTURATOR OPTICALSTD 8 DVNC (TROCAR) ×1 IMPLANT
PACK GYN LAPAROSCOPIC (MISCELLANEOUS) ×2 IMPLANT
PAD ARMBOARD 7.5X6 YLW CONV (MISCELLANEOUS) ×2 IMPLANT
PAD OB MATERNITY 4.3X12.25 (PERSONAL CARE ITEMS) ×2 IMPLANT
PAD PREP 24X41 OB/GYN DISP (PERSONAL CARE ITEMS) ×2 IMPLANT
SCISSORS METZENBAUM CVD 33 (INSTRUMENTS) ×1 IMPLANT
SCRUB EXIDINE 4% CHG 4OZ (MISCELLANEOUS) ×2 IMPLANT
SEAL CANN UNIV 5-8 DVNC XI (MISCELLANEOUS) ×4 IMPLANT
SEAL XI 5MM-8MM UNIVERSAL (MISCELLANEOUS) ×4
SET TUBE SMOKE EVAC HIGH FLOW (TUBING) ×2 IMPLANT
SOLUTION ELECTROLUBE (MISCELLANEOUS) ×2 IMPLANT
SPONGE GAUZE 2X2 8PLY STRL LF (GAUZE/BANDAGES/DRESSINGS) ×8 IMPLANT
STAPLER CANNULA SEAL DVNC XI (STAPLE) ×1 IMPLANT
STAPLER CANNULA SEAL XI (STAPLE) ×2
STRAP SAFETY 5IN WIDE (MISCELLANEOUS) ×2 IMPLANT
SURGILUBE 2OZ TUBE FLIPTOP (MISCELLANEOUS) ×2 IMPLANT
SUT MNCRL 4-0 (SUTURE) ×4
SUT MNCRL 4-0 27XMFL (SUTURE) ×2
SUT VIC AB 0 CT1 36 (SUTURE) ×2 IMPLANT
SUT VICRYL 0 AB UR-6 (SUTURE) ×2 IMPLANT
SUTURE MNCRL 4-0 27XMF (SUTURE) ×1 IMPLANT
SYR 10ML LL (SYRINGE) ×2 IMPLANT
WATER STERILE IRR 500ML POUR (IV SOLUTION) ×2 IMPLANT

## 2021-02-04 NOTE — Interval H&P Note (Signed)
History and Physical Interval Note:  02/04/2021 7:23 AM  Ashley Barrett  has presented today for surgery, with the diagnosis of Left ovarian cyst.  The various methods of treatment have been discussed with the patient and family. After consideration of risks, benefits and other options for treatment, the patient has consented to  Procedure(s): XI ROBOTIC [place] (Left) as a surgical intervention.  The patient's history has been reviewed, patient examined, no change in status, stable for surgery.  I have reviewed the patient's chart and labs.  Questions were answered to the patient's satisfaction.     [place]

## 2021-02-04 NOTE — Anesthesia Procedure Notes (Signed)
Procedure Name: Intubation Date/Time: 02/04/2021 7:36 AM Performed by: Beverely Low, CRNA Pre-anesthesia Checklist: Patient identified, Emergency Drugs available, Suction available, Patient being monitored and Timeout performed Patient Re-evaluated:Patient Re-evaluated prior to induction Oxygen Delivery Method: Circle system utilized Preoxygenation: Pre-oxygenation with 100% oxygen Induction Type: IV induction Ventilation: Mask ventilation without difficulty Laryngoscope Size: McGraph and 4 Grade View: Grade I Tube type: Oral Tube size: 7.0 mm Number of attempts: 1 Airway Equipment and Method: Stylet Placement Confirmation: ETT inserted through vocal cords under direct vision, positive ETCO2 and breath sounds checked- equal and bilateral Secured at: 22 cm Tube secured with: Tape Dental Injury: Teeth and Oropharynx as per pre-operative assessment

## 2021-02-04 NOTE — Anesthesia Preprocedure Evaluation (Signed)
Anesthesia Evaluation  Patient identified by MRN, date of birth, ID band Patient awake    Reviewed: Allergy & Precautions, H&P , NPO status , Patient's Chart, lab work & pertinent test results, reviewed documented beta blocker date and time   History of Anesthesia Complications (+) PONV and history of anesthetic complications  Airway Mallampati: II  TM Distance: >3 FB Neck ROM: full    Dental  (+) Dental Advidsory Given, Caps, Teeth Intact, Chipped   Pulmonary neg pulmonary ROS,    Pulmonary exam normal breath sounds clear to auscultation       Cardiovascular Exercise Tolerance: Good negative cardio ROS Normal cardiovascular exam Rhythm:regular Rate:Normal     Neuro/Psych negative neurological ROS  negative psych ROS   GI/Hepatic Neg liver ROS, GERD  ,  Endo/Other  negative endocrine ROS  Renal/GU negative Renal ROS  negative genitourinary   Musculoskeletal   Abdominal   Peds  Hematology negative hematology ROS (+)   Anesthesia Other Findings Past Medical History: No date: Abnormal Pap smear of cervix No date: PONV (postoperative nausea and vomiting)   Reproductive/Obstetrics negative OB ROS                             Anesthesia Physical Anesthesia Plan  ASA: 2  Anesthesia Plan: General   Post-op Pain Management:    Induction: Intravenous  PONV Risk Score and Plan: 4 or greater and Ondansetron, Dexamethasone, Propofol infusion, TIVA, Midazolam and Treatment may vary due to age or medical condition  Airway Management Planned: Oral ETT  Additional Equipment:   Intra-op Plan:   Post-operative Plan: Extubation in OR  Informed Consent: I have reviewed the patients History and Physical, chart, labs and discussed the procedure including the risks, benefits and alternatives for the proposed anesthesia with the patient or authorized representative who has indicated his/her  understanding and acceptance.     Dental Advisory Given  Plan Discussed with: Anesthesiologist, CRNA and Surgeon  Anesthesia Plan Comments:         Anesthesia Quick Evaluation

## 2021-02-04 NOTE — Discharge Instructions (Addendum)
Postoperative Instructions  Take Motrin 600 mg and Tylenol 1000 mg every 6 hours for pain control.   I recommend taking this medication consistently for at least 1 week after your surgery.  Take Roxicodone 5 mg as needed every 6 hours for severe pain.  Some people will take this before bed to make it easier to sleep.  Do not take this pain medicine longer than 7 days after your surgery.  Please take small walks around your living space every 2-3 hours after your surgery.  You can start this the day of your surgery or the day after your surgery.  Continue this for the first 7 days after surgery. After the first 7 days you can slowly increase you activity.  Do not lift heavy objects for 2-4 weeks after the surgery.  Nothing in the vagina for 1 week.   Try not to strain with bowel movements.  It can be a good idea to take an over-the-counter stool softener after your surgery.  You can take Colace 100 mg twice a day.  Try to drink 60 to 90 ounces of water a day to keep her stools soft.  After the surgery your diet should consist of foods that are easy on your stomach.  Soups, sandwiches, crackers, rice, applesauce, popsicles, mashed potatoes, cooked vegetables or canned fruits are best.  After you have a normal bowel movement you can return to your regular diet.  You should have a bowel movement within 3 days of the surgery.  If you not able to have a bowel movement please contact her office so we can review over-the-counter options to help have a bowel movement.  Walking regularly will help with having a bowel movement.  Small amounts of bleeding are typical after surgery.   If you have a bandage covering your incisions he could remove it the day after your surgery.  Your incisions are closed with dissolvable suture and a topical glue.  The topical glue you can peel off gently in the shower 2 weeks after your surgery.  You should shower every day after the surgery.  You can let soapy water gently run  over the incisions to keep them clean.  Please contact us if your incisions appear infected or if you have any drainage of fluid from the incisions.  I would like to know about these symptoms same day since wound infections can get bad fast.      AMBULATORY SURGERY  DISCHARGE INSTRUCTIONS   The drugs that you were given will stay in your system until tomorrow so for the next 24 hours you should not:  Drive an automobile Make any legal decisions Drink any alcoholic beverage   You may resume regular meals tomorrow.  Today it is better to start with liquids and gradually work up to solid foods.  You may eat anything you prefer, but it is better to start with liquids, then soup and crackers, and gradually work up to solid foods.   Please notify your doctor immediately if you have any unusual bleeding, trouble breathing, redness and pain at the surgery site, drainage, fever, or pain not relieved by medication.    Additional Instructions:   Please contact your physician with any problems or Same Day Surgery at [phone], Monday through Friday 6 am to 4 pm, or [place] at [facility] number at [phone].

## 2021-02-04 NOTE — Transfer of Care (Signed)
Immediate Anesthesia Transfer of Care Note  Patient: Ashley Barrett  Procedure(s) Performed: XI ROBOTIC ASSISTED LAPAROSCOPIC OVARIAN CYSTECTOMY (Left)  Patient Location: PACU  Anesthesia Type:General  Level of Consciousness: drowsy  Airway & Oxygen Therapy: Patient Spontanous Breathing  Post-op Assessment: Report given to RN and Post -op Vital signs reviewed and stable  Post vital signs: Reviewed and stable  Last Vitals:  Vitals Value Taken Time  BP 125/82 02/04/21 1219  Temp    Pulse 83 02/04/21 1220  Resp 13 02/04/21 1220  SpO2 98 % 02/04/21 1220  Vitals shown include unvalidated device data.  Last Pain:  Vitals:   02/04/21 0619  TempSrc: Temporal  PainSc: 0-No pain         Complications: There were no known notable events for this encounter.

## 2021-02-04 NOTE — Op Note (Signed)
°  Operative Note    PRE-OP DIAGNOSIS: Left ovarian cyst   POST-OP DIAGNOSIS: Left ovarian cyst  SURGEON: Adrian Prows MD  ASSISTANT: Santiago Glad MD  ANESTHESIA: General  PROCEDURE: Xi Robotic-Assisted Laparoscopic Left Ovarian Cystectomy   ESTIMATED BLOOD LOSS: 100 cc  DRAINS: Foley  SPECIMENS:  Left ovarian cyst wall  COMPLICATIONS: None  DISPOSITION: PACU  CONDITION: Stable  INDICATIONS: Enlarged ovarian cyst  FINDINGS: Exam under anesthesia revealed:  A large 20 cm left ovarian cyst. Normal uterus and normal fallopian tubes. Normal right ovary.  Intraoperative findings included:  The upper abdomen was normal including omentum, bowel, liver, stomach, and diaphragmatic surfaces.   PROCEDURE IN DETAIL: After informed consent was obtained, the patient was taken to the operating room where anesthesia was obtained without difficulty. The patient was positioned in the dorsal lithotomy position in [place] and her arms were carefully tucked at her sides and the usual precautions were taken.  She was prepped and draped in normal sterile fashion.  Time-out was performed. The patient was prepped and draped in normal sterile fashion.A foley catheter was placed.  A speculum was placed in the vagina and the cervical os was dilator. The uterus sounded to 9 cm.  A standard zumi uterine manipulator was then placed in the uterus without incident.    Operative entry was obtained via a left upper quadrant incision and direct entry. The Optiview port was placed, abdomen insuffulated, and pelvis visualized with noted findings above. Left and right 8 mm robotic ports were placed in the left and right quadrants in a straight line with the umbilical incision. Pelvic washings were collected.   A step up port was used to placed a trocar through an umbilical incision and into the left ovarian cyst. 1690 cc of clear fluid was drained from the ovarian cyst. The patient was placed in  Trendelenburg and the bowel was displaced up into the upper abdomen.  Robotic docking was performed.   The robotic scissor and robotic maryland bipolar were used to incise the anterior surface of the ovarian cyst. The cyst wall was dissected and removed.  The cyst wall was placed into a laparoscopic bag through the umbilical port and removed.   The ovary was inspected and was hemostatic. Fibrillar was applied. The ovarian capsule was the suture closed with 0-vicryl suture with a running stitch.  Excellent hemostasis was noted.   The abdomen and pelvis were irrigated.  Once the procedure was completed all instruments were removed and the robot was undocked. The trocars were removed. The umbilical fascia was closed with a PMI device and 0-vicryl suture. The skin incision were closed using 4-0 monocryl with a subcuticular stitch and Indermil glue.  The patient tolerated the procedure well.  Sponge, lap and needle counts were correct x2.  The patient was taken to recovery room in excellent condition.  The foley and uterine manipulator were removed.   Dr. Theora Gianotti assisted with this case. This was a high level case requiring a Physicist, medical. No other assistant was readily available. She assisted with irrigation and retraction of tissue during the case.   Adrian Prows MD, [facility], [facility] 02/04/2021 12:10 PM

## 2021-02-05 ENCOUNTER — Encounter: Payer: Self-pay | Admitting: Obstetrics and Gynecology

## 2021-02-06 LAB — SURGICAL PATHOLOGY

## 2021-02-06 LAB — CYTOLOGY - NON PAP

## 2021-02-07 NOTE — Anesthesia Postprocedure Evaluation (Signed)
Anesthesia Post Note  Patient: Ashley Barrett  Procedure(s) Performed: XI ROBOTIC ASSISTED LAPAROSCOPIC OVARIAN CYSTECTOMY (Left)  Patient location during evaluation: PACU Anesthesia Type: General Level of consciousness: awake and alert Pain management: pain level controlled Vital Signs Assessment: post-procedure vital signs reviewed and stable Respiratory status: spontaneous breathing, nonlabored ventilation, respiratory function stable and patient connected to nasal cannula oxygen Cardiovascular status: blood pressure returned to baseline and stable Postop Assessment: no apparent nausea or vomiting Anesthetic complications: no   There were no known notable events for this encounter.   Last Vitals:  Vitals:   02/04/21 1426 02/04/21 1509  BP: 97/64 (!) 93/55  Pulse: (!) 58 (!) 58  Resp: 15 16  Temp: (!) 36.3 C   SpO2: 100% 99%    Last Pain:  Vitals:   02/05/21 0953  TempSrc:   PainSc: 2                  Martha Clan

## 2021-02-09 ENCOUNTER — Ambulatory Visit (INDEPENDENT_AMBULATORY_CARE_PROVIDER_SITE_OTHER): Payer: Self-pay | Admitting: Obstetrics and Gynecology

## 2021-02-09 ENCOUNTER — Encounter: Payer: Self-pay | Admitting: Obstetrics and Gynecology

## 2021-02-09 ENCOUNTER — Other Ambulatory Visit: Payer: Self-pay

## 2021-02-09 VITALS — BP 124/70 | Ht 66.0 in | Wt 146.0 lb

## 2021-02-09 DIAGNOSIS — Z4889 Encounter for other specified surgical aftercare: Secondary | ICD-10-CM

## 2021-02-09 NOTE — Progress Notes (Signed)
°  Postoperative Follow-up Patient presents post op from  Aneth- assisted Laparoscopic Ovarian Cystectomy  for  left ovarian cyst , 1 week ago.  Subjective: She reports that since the surgery she has been feeling well.  She has had a bowel movement 3 times.  She discontinued Reglan.  She noted a rash on her stomach which she has been applying hydrocortisone to. Eating a regular diet without difficulty.  Pain is well controlled.    Activity: sedentary.   Objective: BP 124/70    Ht 5\' 6"  (1.676 m)    Wt 146 lb (66.2 kg)    LMP 02/07/2021    BMI 23.57 kg/m  Physical Exam Constitutional:      Appearance: Normal appearance. She is well-developed.  HENT:     Head: Normocephalic and atraumatic.  Eyes:     Extraocular Movements: Extraocular movements intact.     Pupils: Pupils are equal, round, and reactive to light.  Neck:     Thyroid: No thyromegaly.  Cardiovascular:     Rate and Rhythm: Normal rate and regular rhythm.     Heart sounds: Normal heart sounds.  Pulmonary:     Effort: Pulmonary effort is normal.     Breath sounds: Normal breath sounds.  Abdominal:     General: Bowel sounds are normal. There is no distension.     Palpations: Abdomen is soft. There is no mass.     Comments: Incisions are clean, dry, and intact  Musculoskeletal:     Cervical back: Neck supple.  Neurological:     Mental Status: She is alert and oriented to person, place, and time.  Skin:    General: Skin is warm and dry.  Psychiatric:        Behavior: Behavior normal.        Thought Content: Thought content normal.        Judgment: Judgment normal.  Vitals reviewed.    Assessment: s/p :   Xi Robot- assisted Laparoscopic Ovarian Cystectomy stable  Plan:  Patient has done well after surgery with no apparent complications.   I have discussed the post-operative course to date, and the expected progress moving forward.  The patient understands what complications to be concerned about.  I will see  the patient in routine follow up, or sooner if needed.    Activity plan: No heavy lifting.     Adrian Prows MD, [facility] 02/09/2021 2:58 PM

## 2021-02-12 ENCOUNTER — Encounter: Payer: Self-pay | Admitting: Obstetrics and Gynecology

## 2021-02-20 ENCOUNTER — Encounter: Payer: Self-pay | Admitting: Obstetrics and Gynecology

## 2021-02-23 ENCOUNTER — Encounter: Payer: Self-pay | Admitting: Obstetrics and Gynecology

## 2021-02-24 ENCOUNTER — Encounter: Payer: Self-pay | Admitting: Obstetrics and Gynecology

## 2021-03-13 ENCOUNTER — Encounter: Payer: Self-pay | Admitting: Obstetrics and Gynecology

## 2021-04-03 ENCOUNTER — Encounter: Payer: Self-pay | Admitting: Obstetrics and Gynecology

## 2022-07-19 IMAGING — US US ABDOMEN COMPLETE
1 series · 13 of 25 positions shown · non-contrast
Comparison: None.

CLINICAL DATA: Abdominal pain

EXAM:
ABDOMEN ULTRASOUND COMPLETE

[Series 1: us abdomen complete · 0.20mm/px · 13 of 122 slices shown]
[im 1/122]
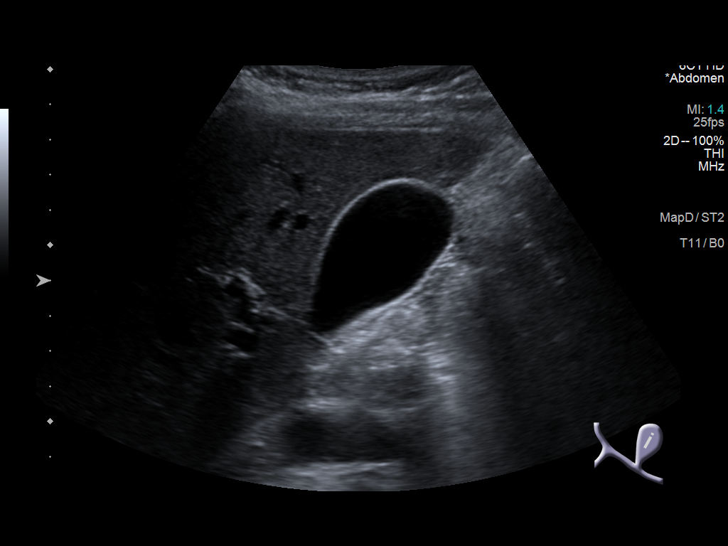
[im 11/122]
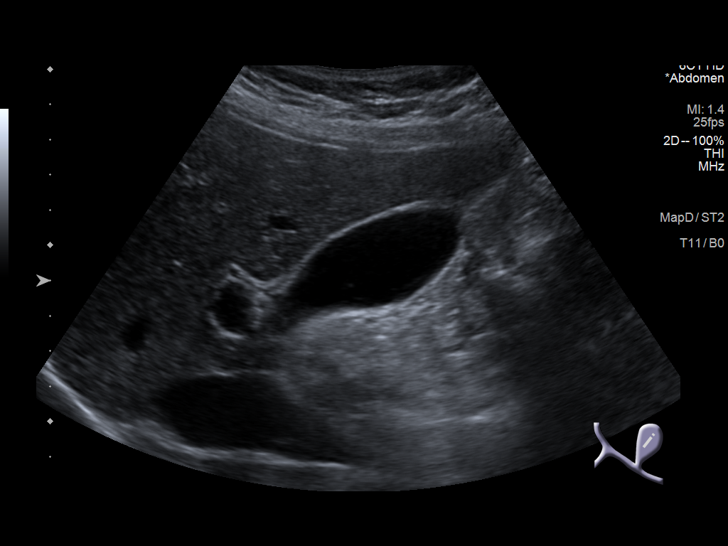
[im 21/122]
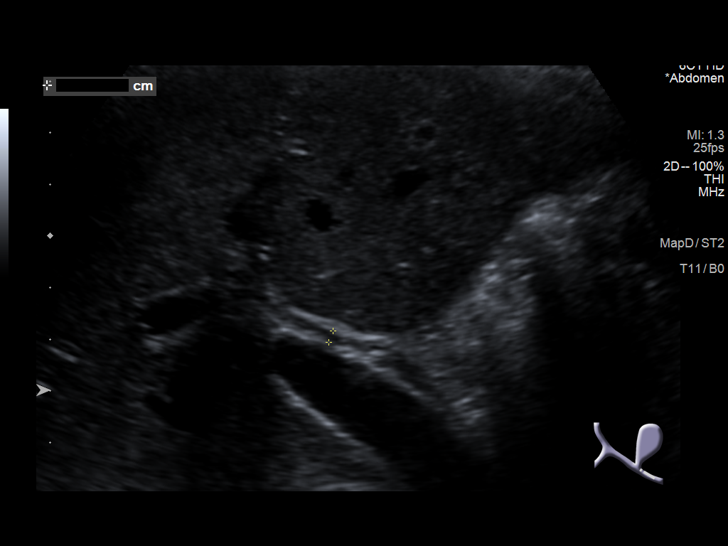
[im 31/122]
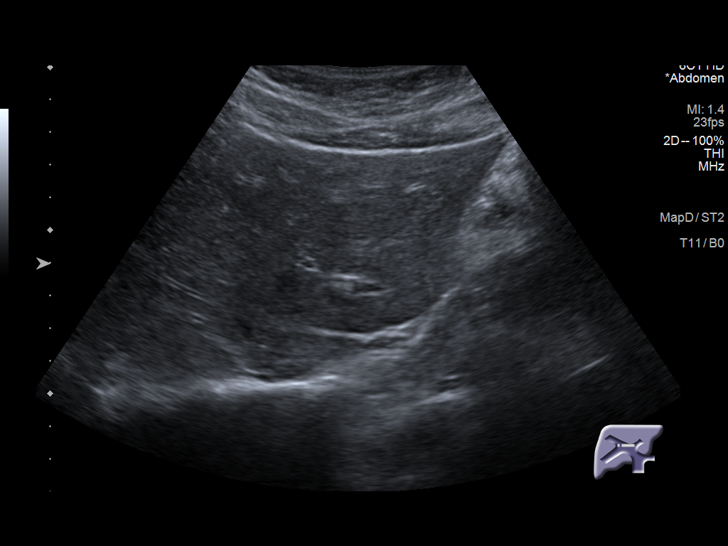
[im 41/122]
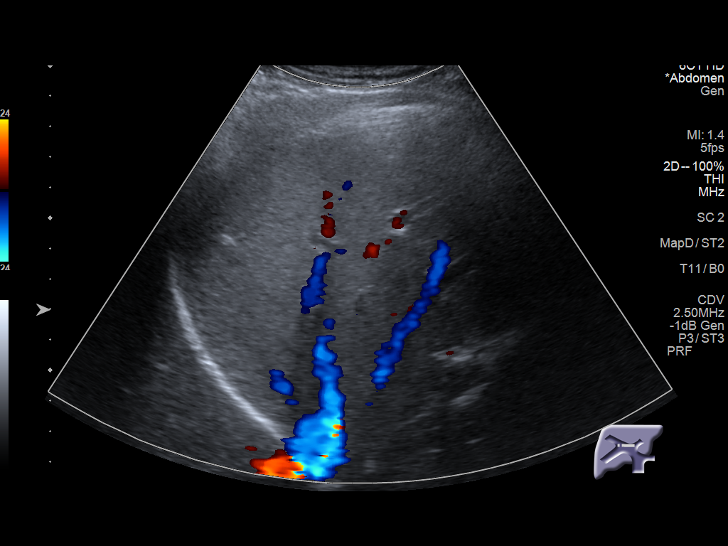
[im 51/122]
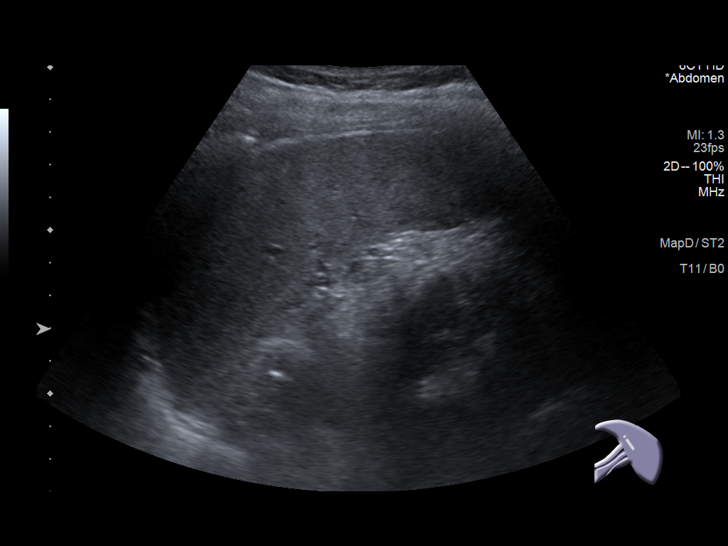
[im 61/122]
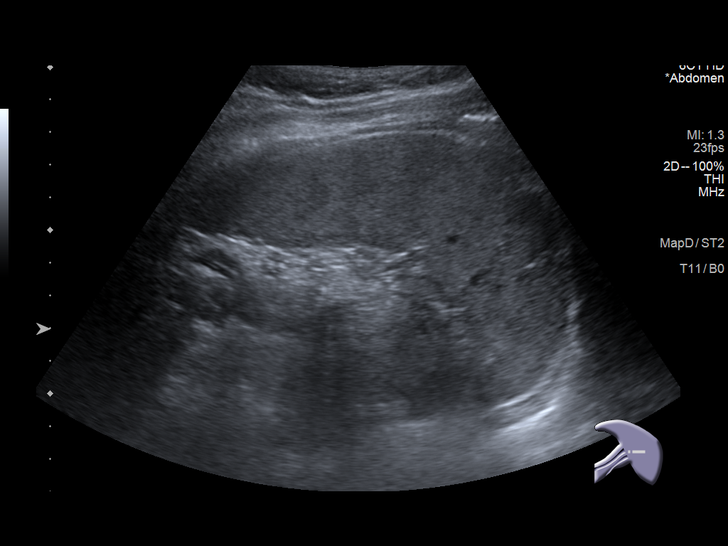
[im 71/122]
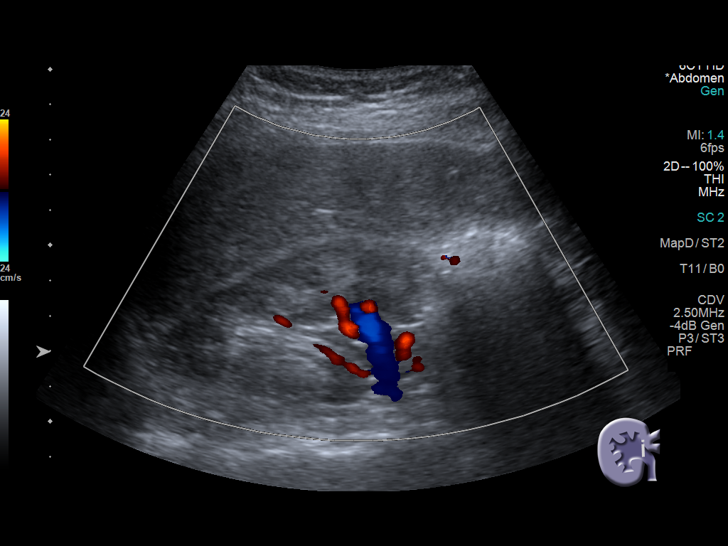
[im 81/122]
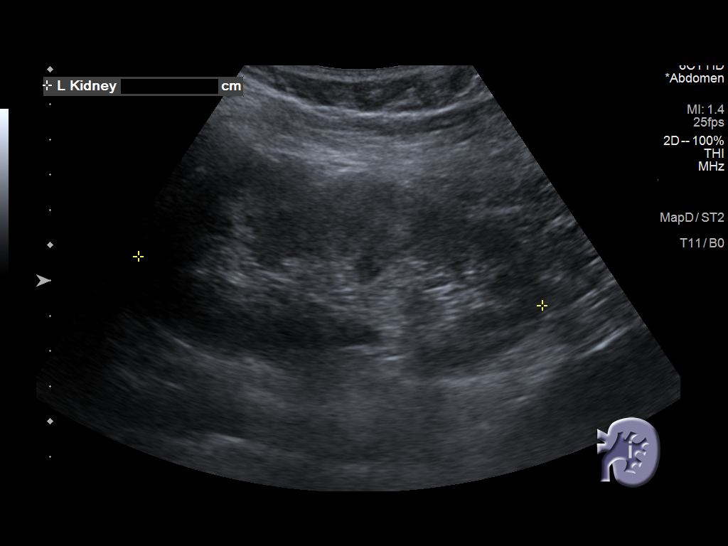
[im 91/122]
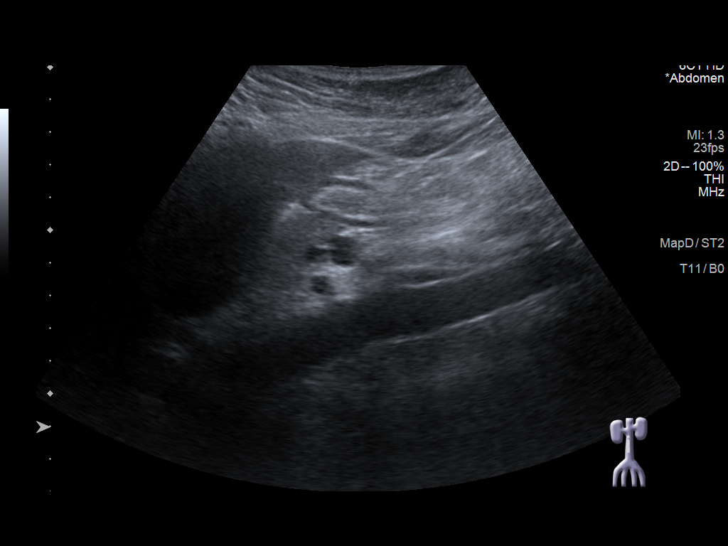
[im 101/122]
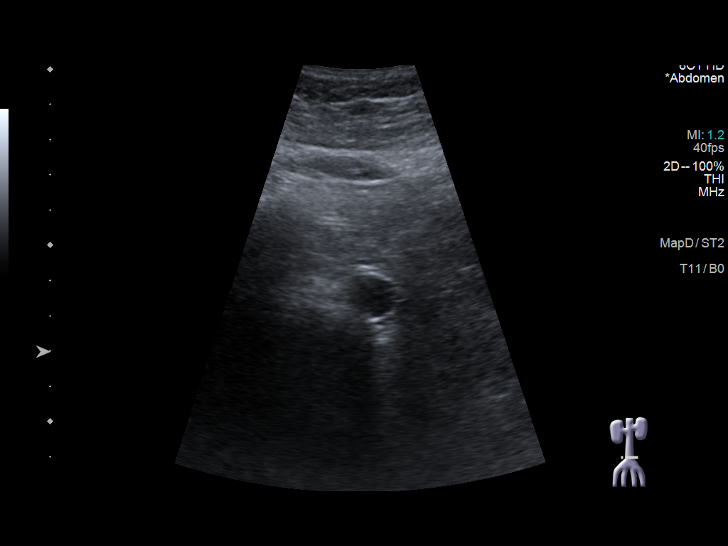
[im 111/122]
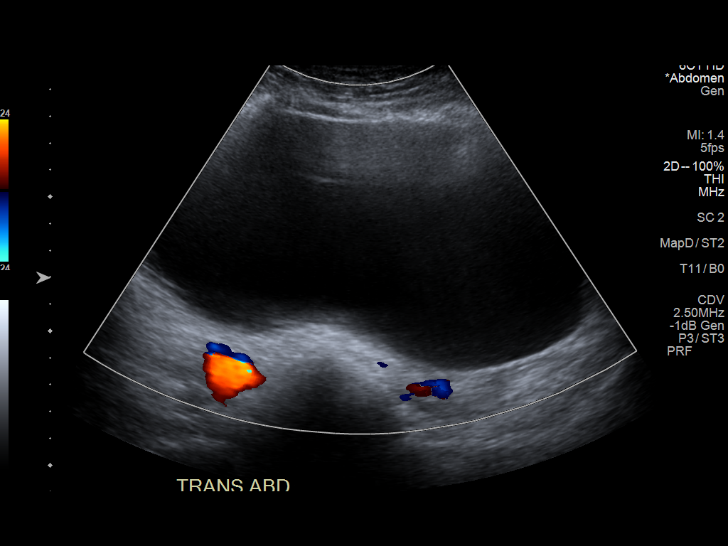
[im 122/122]
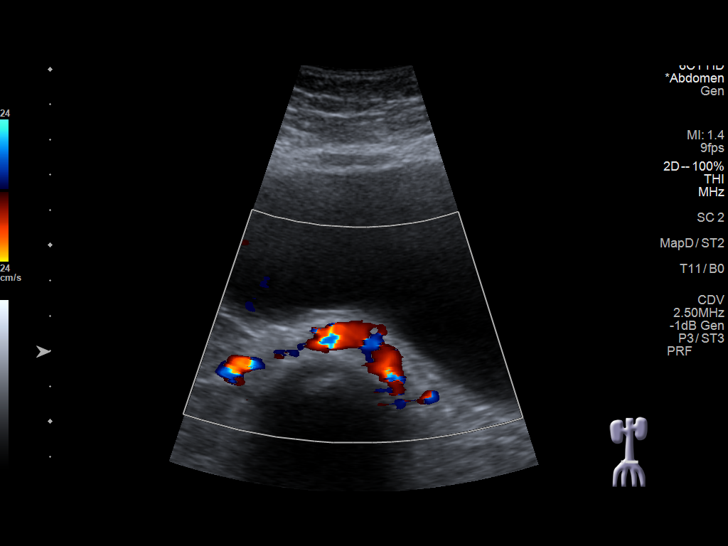

[13 of 25 positions shown; findings below may reference images not displayed]

FINDINGS: Gallbladder: No gallstones or wall thickening visualized. No
sonographic Murphy sign noted by sonographer.

Common bile duct: Diameter: 3

Liver: No focal lesion identified. Within normal limits in
parenchymal echogenicity. Portal vein is patent on color Doppler
imaging with normal direction of blood flow towards the liver.

IVC: No abnormality visualized.

Pancreas: Visualized portion unremarkable.

Spleen: Size and appearance within normal limits.

Right Kidney: Length: 11. Echogenicity within normal limits. No mass
or hydronephrosis visualized.

Left Kidney: Length: 11.5. Echogenicity within normal limits. No
mass or hydronephrosis visualized.

Abdominal aorta: No aneurysm visualized.

Other findings: Large well-defined anechoic cystic structure with
increased through transmission visualized in the midline abdomen
measuring 17.8 x 9.4 x 16.9 cm with no internal or significant
peripheral Doppler flow. This structure appears separate from the
urinary bladder.
IMPRESSION: Large cystic mass in the mid abdomen of uncertain etiology. Consider
follow-up CT abdomen and pelvis with contrast.

## 2022-07-23 IMAGING — CT CT ABD-PELV W/ CM
2 of 4 series · 15 of 46 positions shown, 17 images · IV contrast (APPLIED)
Comparison: Ultrasound 11/13/2020

CLINICAL DATA: Evaluation of cystic mass seen on prior ultrasound.

EXAM:
CT ABDOMEN AND PELVIS WITH CONTRAST
TECHNIQUE: Multidetector CT imaging of the abdomen and pelvis was performed
using the standard protocol following bolus administration of
intravenous contrast.
CONTRAST:  100mL OMNIPAQUE IOHEXOL 300 MG/ML  SOLN

[Series 2: axial st · axial · 0.75mm/px · z∈[-1020,-600]mm · 12 of 94 slices shown, 14 images]
[im 5/94  soft-tissue]
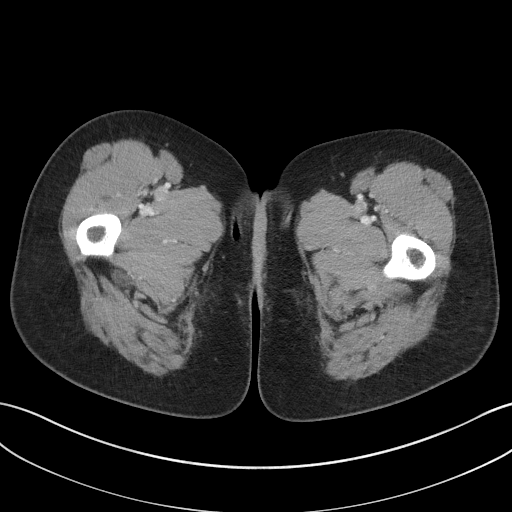
[im 5/94  bone]
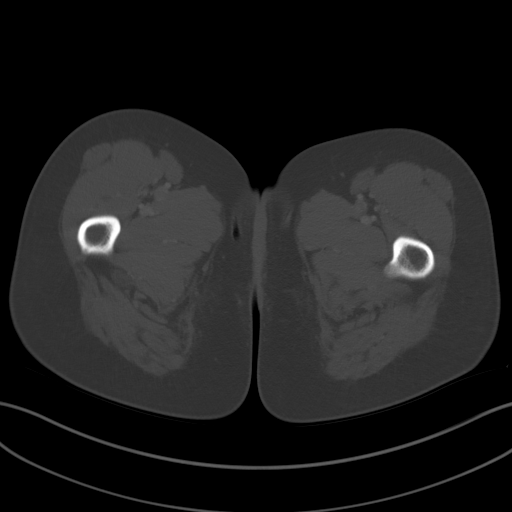
[im 13/94  soft-tissue]
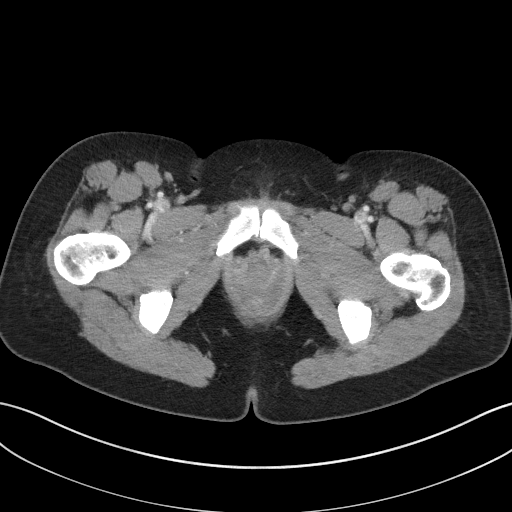
[im 21/94  soft-tissue]
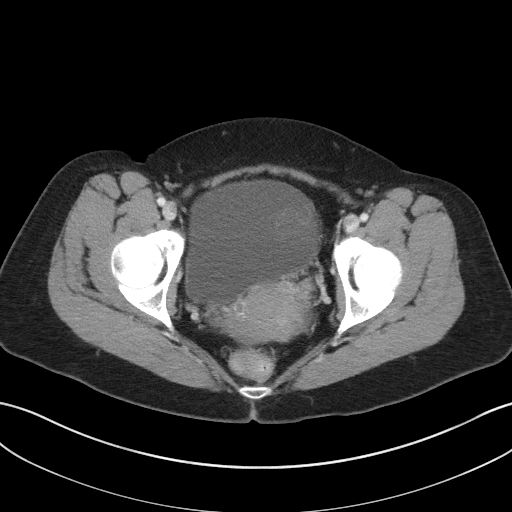
[im 29/94  soft-tissue]
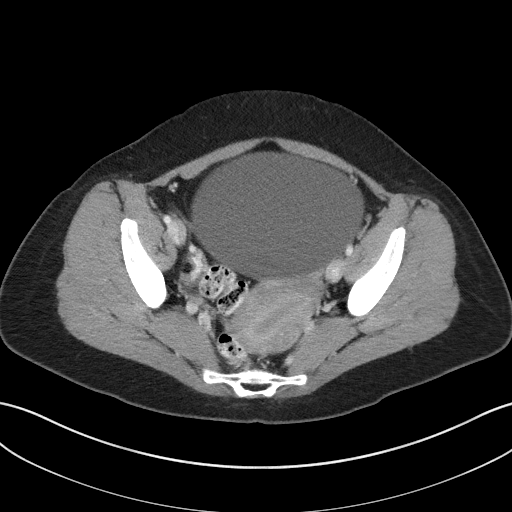
[im 37/94  soft-tissue]
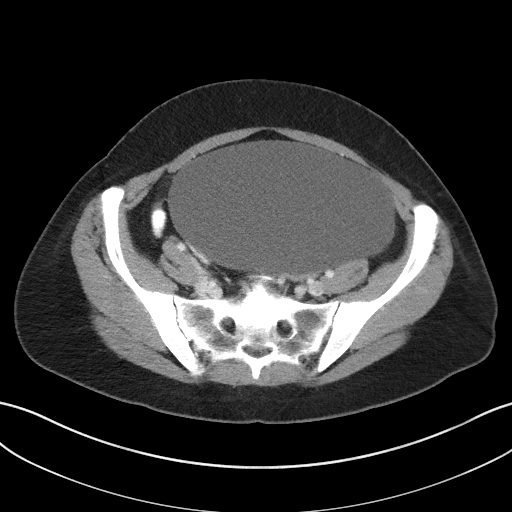
[im 45/94  soft-tissue]
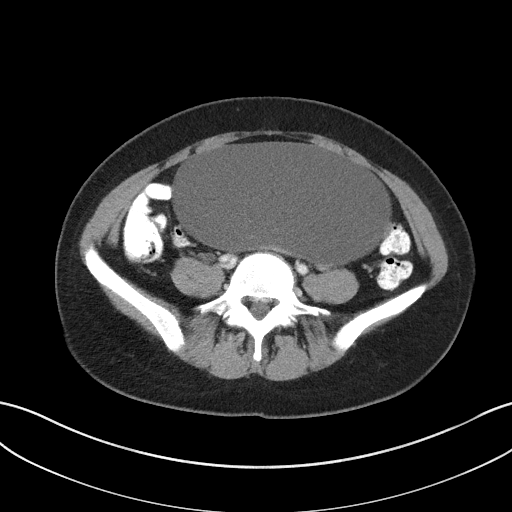
[im 49/94  soft-tissue]
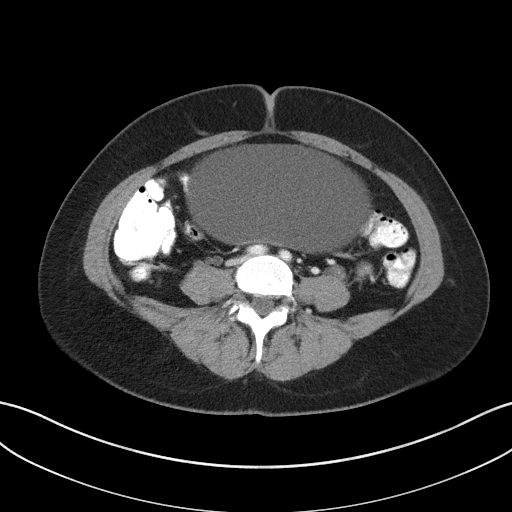
[im 57/94  soft-tissue]
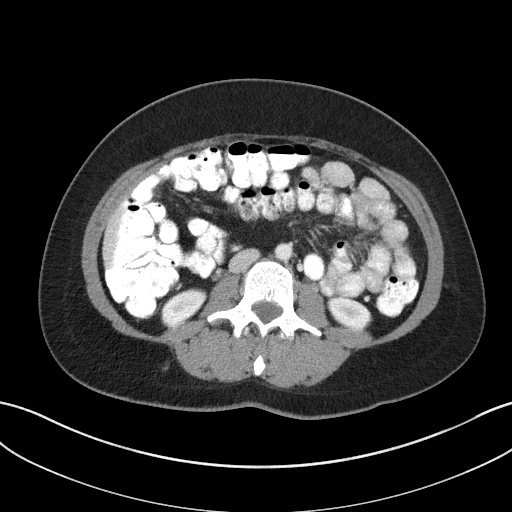
[im 65/94  soft-tissue]
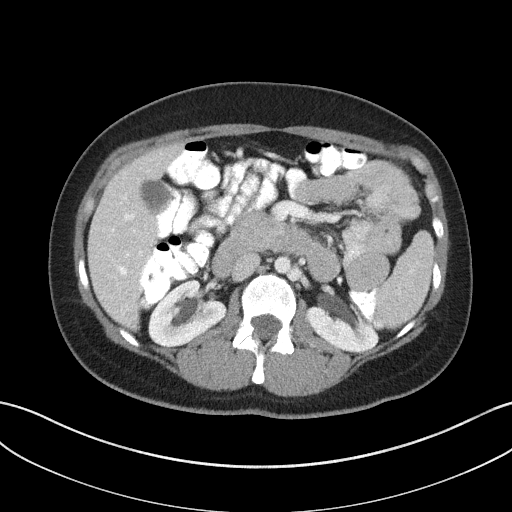
[im 65/94  bone]
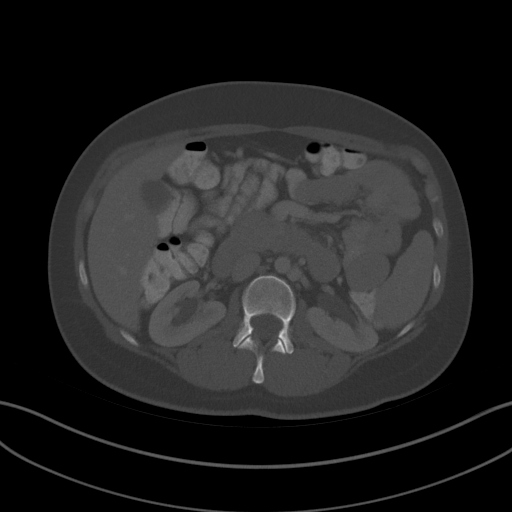
[im 73/94  soft-tissue]
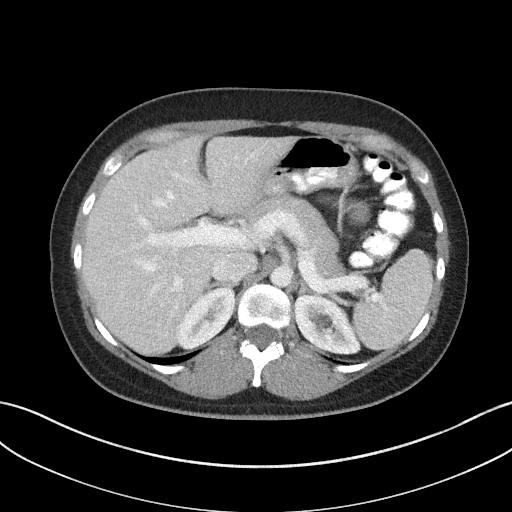
[im 81/94  soft-tissue]
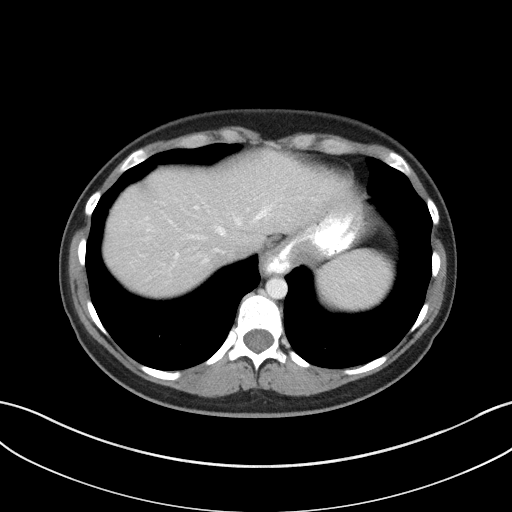
[im 89/94  soft-tissue]
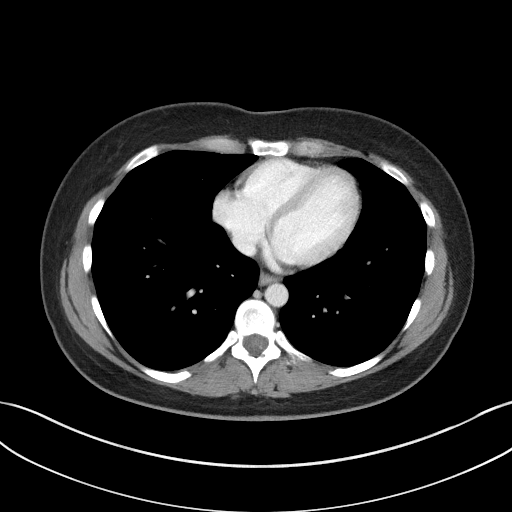

[Series 5: coronal st · coronal · 0.65mm/px · 3 of 81 slices shown]
[im 27/81  soft-tissue]
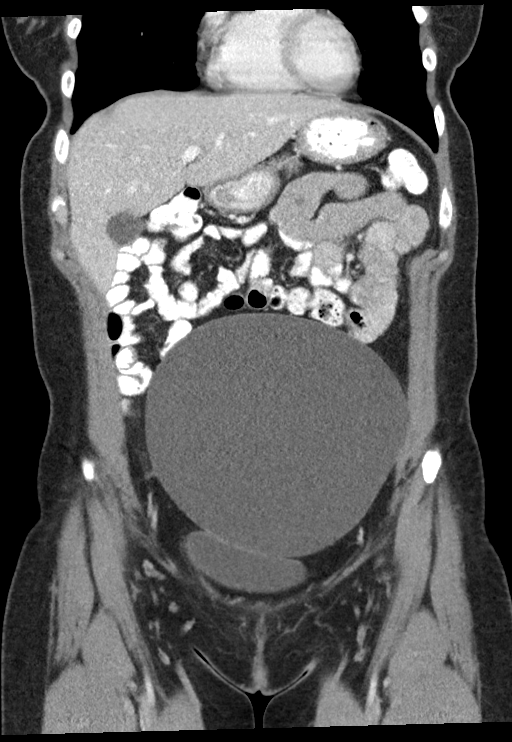
[im 36/81  soft-tissue]
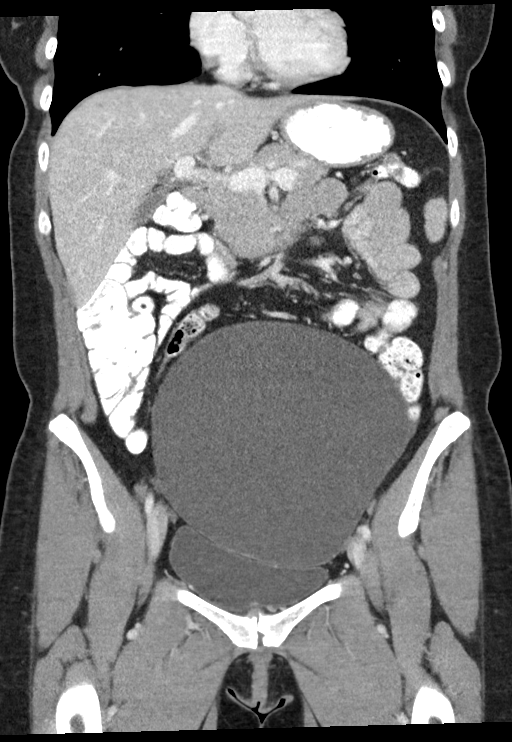
[im 45/81  soft-tissue]
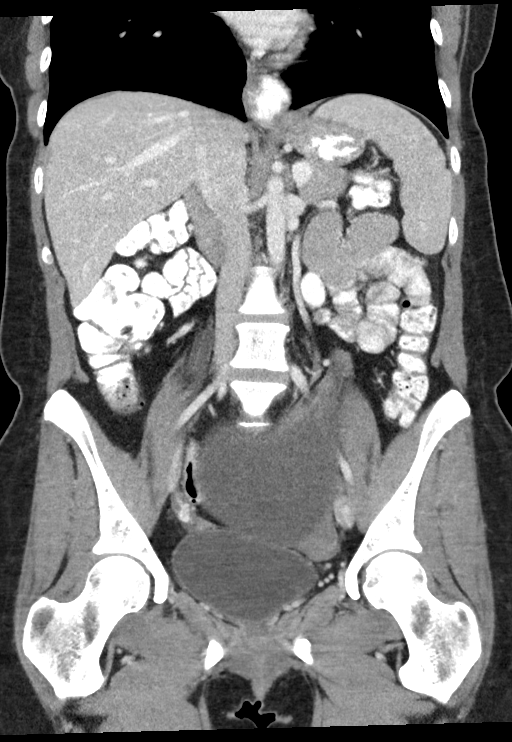

[15 of 46 positions shown; findings below may reference images not displayed]

FINDINGS: Lower chest: No acute abnormality.

Hepatobiliary: No suspicious hepatic lesion. Gallbladder is
unremarkable. No biliary ductal dilation.

Pancreas: No pancreatic ductal dilation or evidence of acute
inflammation.

Spleen: Within normal limits.

Adrenals/Urinary Tract: Bilateral adrenal glands are unremarkable.
No hydronephrosis. No solid enhancing renal mass. Urinary bladder is
unremarkable for degree of distension.

Stomach/Bowel: Radiopaque enteric contrast traverses the rectum.
Moderate hiatal hernia with radiopaque contrast material in the
distal esophagus. No pathologic dilation of large or small bowel.
The appendix or appendiceal remnant is visualized appears
unremarkable. Terminal ileum appears normal. No evidence of acute
bowel inflammation.

Vascular/Lymphatic: No abdominal aortic aneurysm. No pathologically
enlarged abdominal or pelvic lymph nodes.

Reproductive: Fluid attenuation cystic lesion arising from the left
adnexa which effaces the urinary bladder uterus and loops of large
and small bowel measuring 16.2 x 10.1 x 49.8c cm. No thickened
enhancing internal septations or areas of enhancing soft tissue
nodularity visualized.

2.3 cm corpus luteal cyst in the right ovary. Physiologic uterus is
unremarkable.

Other: No abdominopelvic ascites.

Musculoskeletal: L5-S1 discogenic disease. No acute osseous
abnormality.
IMPRESSION: 1. Fluid attenuation cystic lesion arising from the left adnexa
measuring up to 16.7 cm, without thickened enhancing internal
septations or soft tissue nodularity visualized. Most consistent
with a cystic ovarian neoplasm, recommend gynecologic surgery
consult for further evaluation.
2. Moderate hiatal hernia with radiopaque contrast material in the
distal esophagus suggesting gastroesophageal reflux.

These results will be called to the ordering clinician or
representative by the Radiologist Assistant, and communication
documented in the PACS or [REDACTED].

## 2022-10-26 DIAGNOSIS — Z1509 Genetic susceptibility to other malignant neoplasm: Secondary | ICD-10-CM

## 2022-10-26 HISTORY — DX: Genetic susceptibility to other malignant neoplasm: Z15.09

## 2022-11-18 ENCOUNTER — Encounter: Payer: Self-pay | Admitting: Obstetrics and Gynecology

## 2022-11-18 ENCOUNTER — Telehealth: Payer: Self-pay | Admitting: Obstetrics and Gynecology

## 2022-11-18 NOTE — Telephone Encounter (Signed)
LMTRC. 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  I spoke with Dr. Jerene Pitch who last saw pt 4/24 in [facility] and she is aware. She wants me to reach out to pt with results and do ref locally, and she will f/u with pt as well.

## 2022-11-23 ENCOUNTER — Encounter: Payer: Self-pay | Admitting: Obstetrics and Gynecology

## 2022-11-23 NOTE — Telephone Encounter (Signed)
This encounter was created in error - please disregard.

## 2022-11-23 NOTE — Telephone Encounter (Addendum)
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 Discussed cancer risks and mgmt (see below). Pt will do GYN eval with Dr. Bjorn Pippin at her 4/25 annual; she will notify me of GI in-network locally for GI ref. Copy of results mailed to pt (address confirmed) and faxed to Dr. Jerene Pitch, as well as scanned into Epic.  Patient understands her children and other family members should do cancer genetic testing. FH is mat aunt with breast cancer and prostate cancer in pat uncle; unsure which side of family this mutation is coming from.   Hard copy mailed to pt. F/u prn.    Gastric/Small bowel/colorectal: refer to GI for upper endoscopy Q2-4 yrs/colonoscopy Q1-3 yrs.   Ovarian: GYN u/s and ca-125 vs BSO.  Endometrial: Q1-2 yrs EMB ang GYN u/s; consider hyst. Urothelial: can do yearly UA (Pt will do GYN eval with Dr. Jerene Pitch)  Hepatobiliary tract: no med mgmt guidelines Brain: no med mgmt guidelines Pancreatic: no FH so no screening options currently  Message sent to pt via MyChart recapping this.  Pt should also do yearly mammos now due to FH breast cancer in mat aunt in 68s. I can place ref prn.
